# Patient Record
Sex: Female | Born: 2000 | Race: White | Hispanic: No | Marital: Single | Smoking: Never smoker
Health system: Southern US, Community
[De-identification: ages and names within clinical notes are randomized; demographics above are authoritative.]

## PROBLEM LIST (undated history)

## (undated) DIAGNOSIS — F909 Attention-deficit hyperactivity disorder, unspecified type: Secondary | ICD-10-CM

## (undated) DIAGNOSIS — F988 Other specified behavioral and emotional disorders with onset usually occurring in childhood and adolescence: Secondary | ICD-10-CM

## (undated) HISTORY — DX: Other specified behavioral and emotional disorders with onset usually occurring in childhood and adolescence: F98.8

---
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
## 2021-05-05 ENCOUNTER — Ambulatory Visit: Payer: Self-pay | Admitting: Nurse Practitioner

## 2021-05-29 ENCOUNTER — Ambulatory Visit (INDEPENDENT_AMBULATORY_CARE_PROVIDER_SITE_OTHER): Payer: Self-pay | Admitting: Nurse Practitioner

## 2021-05-29 ENCOUNTER — Encounter: Payer: Self-pay | Admitting: Nurse Practitioner

## 2021-05-29 VITALS — BP 122/63 | HR 87 | Temp 99.1°F | Ht 62.5 in | Wt 150.6 lb

## 2021-05-29 DIAGNOSIS — J029 Acute pharyngitis, unspecified: Secondary | ICD-10-CM | POA: Insufficient documentation

## 2021-05-29 MED ORDER — CETIRIZINE HCL 10 MG PO TABS: 10.0000 mg | ORAL_TABLET | Freq: Every day | ORAL | 11 refills | Status: DC

## 2021-05-29 MED ORDER — PREDNISONE 20 MG PO TABS: 20.0000 mg | ORAL_TABLET | Freq: Every day | ORAL | 0 refills | Status: AC
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
## 2021-05-29 NOTE — Patient Instructions (Signed)
1. Pharyngitis, unspecified etiology ? ?- predniSONE (DELTASONE) 20 MG tablet; Take 1 tablet (20 mg total) by mouth daily with breakfast for 5 days.  Dispense: 5 tablet; Refill: 0 ?- cetirizine (ZYRTEC) 10 MG tablet; Take 1 tablet (10 mg total) by mouth daily.  Dispense: 30 tablet; Refill: 11 ? ?Follow up: ? ?Follow up if needed ? ?

## 2021-05-29 NOTE — Assessment & Plan Note (Signed)
-   predniSONE (DELTASONE) 20 MG tablet; Take 1 tablet (20 mg total) by mouth daily with breakfast for 5 days.  Dispense: 5 tablet; Refill: 0 ?- cetirizine (ZYRTEC) 10 MG tablet; Take 1 tablet (10 mg total) by mouth daily.  Dispense: 30 tablet; Refill: 11 ? ?Follow up: ? ?Follow up if needed ?

## 2022-03-10 DIAGNOSIS — M62830 Muscle spasm of back: Secondary | ICD-10-CM | POA: Diagnosis not present

## 2022-03-10 DIAGNOSIS — M542 Cervicalgia: Secondary | ICD-10-CM | POA: Diagnosis not present

## 2022-03-10 DIAGNOSIS — M9902 Segmental and somatic dysfunction of thoracic region: Secondary | ICD-10-CM | POA: Diagnosis not present

## 2022-03-10 DIAGNOSIS — M9901 Segmental and somatic dysfunction of cervical region: Secondary | ICD-10-CM | POA: Diagnosis not present

## 2022-04-09 DIAGNOSIS — M9902 Segmental and somatic dysfunction of thoracic region: Secondary | ICD-10-CM | POA: Diagnosis not present

## 2022-04-09 DIAGNOSIS — M542 Cervicalgia: Secondary | ICD-10-CM | POA: Diagnosis not present

## 2022-04-09 DIAGNOSIS — M9901 Segmental and somatic dysfunction of cervical region: Secondary | ICD-10-CM | POA: Diagnosis not present

## 2022-04-09 DIAGNOSIS — M62830 Muscle spasm of back: Secondary | ICD-10-CM | POA: Diagnosis not present

## 2022-04-16 DIAGNOSIS — L700 Acne vulgaris: Secondary | ICD-10-CM | POA: Diagnosis not present

## 2022-04-16 DIAGNOSIS — L650 Telogen effluvium: Secondary | ICD-10-CM | POA: Diagnosis not present

## 2022-04-28 DIAGNOSIS — Z79899 Other long term (current) drug therapy: Secondary | ICD-10-CM | POA: Diagnosis not present

## 2022-04-28 DIAGNOSIS — Z0000 Encounter for general adult medical examination without abnormal findings: Secondary | ICD-10-CM | POA: Diagnosis not present

## 2022-05-07 DIAGNOSIS — M9902 Segmental and somatic dysfunction of thoracic region: Secondary | ICD-10-CM | POA: Diagnosis not present

## 2022-05-07 DIAGNOSIS — M9901 Segmental and somatic dysfunction of cervical region: Secondary | ICD-10-CM | POA: Diagnosis not present

## 2022-05-07 DIAGNOSIS — M62830 Muscle spasm of back: Secondary | ICD-10-CM | POA: Diagnosis not present

## 2022-05-07 DIAGNOSIS — M542 Cervicalgia: Secondary | ICD-10-CM | POA: Diagnosis not present

## 2022-05-14 DIAGNOSIS — Z309 Encounter for contraceptive management, unspecified: Secondary | ICD-10-CM | POA: Diagnosis not present

## 2022-05-14 DIAGNOSIS — Z01419 Encounter for gynecological examination (general) (routine) without abnormal findings: Secondary | ICD-10-CM | POA: Diagnosis not present

## 2022-05-14 DIAGNOSIS — Z6826 Body mass index (BMI) 26.0-26.9, adult: Secondary | ICD-10-CM | POA: Diagnosis not present

## 2022-05-14 DIAGNOSIS — Z113 Encounter for screening for infections with a predominantly sexual mode of transmission: Secondary | ICD-10-CM | POA: Diagnosis not present

## 2022-05-14 DIAGNOSIS — R350 Frequency of micturition: Secondary | ICD-10-CM | POA: Diagnosis not present

## 2022-05-25 DIAGNOSIS — L659 Nonscarring hair loss, unspecified: Secondary | ICD-10-CM | POA: Diagnosis not present

## 2022-06-04 DIAGNOSIS — M542 Cervicalgia: Secondary | ICD-10-CM | POA: Diagnosis not present

## 2022-06-04 DIAGNOSIS — M9901 Segmental and somatic dysfunction of cervical region: Secondary | ICD-10-CM | POA: Diagnosis not present

## 2022-06-04 DIAGNOSIS — M62830 Muscle spasm of back: Secondary | ICD-10-CM | POA: Diagnosis not present

## 2022-06-04 DIAGNOSIS — M9902 Segmental and somatic dysfunction of thoracic region: Secondary | ICD-10-CM | POA: Diagnosis not present

## 2022-06-10 DIAGNOSIS — L648 Other androgenic alopecia: Secondary | ICD-10-CM | POA: Diagnosis not present

## 2022-06-10 DIAGNOSIS — R202 Paresthesia of skin: Secondary | ICD-10-CM | POA: Diagnosis not present

## 2022-06-14 DIAGNOSIS — N3000 Acute cystitis without hematuria: Secondary | ICD-10-CM | POA: Diagnosis not present

## 2022-06-18 DIAGNOSIS — Z803 Family history of malignant neoplasm of breast: Secondary | ICD-10-CM | POA: Diagnosis not present

## 2022-06-23 DIAGNOSIS — L650 Telogen effluvium: Secondary | ICD-10-CM | POA: Diagnosis not present

## 2022-06-23 DIAGNOSIS — L648 Other androgenic alopecia: Secondary | ICD-10-CM | POA: Diagnosis not present

## 2022-07-14 DIAGNOSIS — M542 Cervicalgia: Secondary | ICD-10-CM | POA: Diagnosis not present

## 2022-07-14 DIAGNOSIS — R202 Paresthesia of skin: Secondary | ICD-10-CM | POA: Diagnosis not present

## 2022-07-14 DIAGNOSIS — M9901 Segmental and somatic dysfunction of cervical region: Secondary | ICD-10-CM | POA: Diagnosis not present

## 2022-07-14 DIAGNOSIS — M9902 Segmental and somatic dysfunction of thoracic region: Secondary | ICD-10-CM | POA: Diagnosis not present

## 2022-07-14 DIAGNOSIS — M62830 Muscle spasm of back: Secondary | ICD-10-CM | POA: Diagnosis not present

## 2022-07-20 DIAGNOSIS — R079 Chest pain, unspecified: Secondary | ICD-10-CM | POA: Diagnosis not present

## 2022-07-21 DIAGNOSIS — R079 Chest pain, unspecified: Secondary | ICD-10-CM | POA: Diagnosis not present

## 2022-07-28 DIAGNOSIS — M9902 Segmental and somatic dysfunction of thoracic region: Secondary | ICD-10-CM | POA: Diagnosis not present

## 2022-07-28 DIAGNOSIS — M62830 Muscle spasm of back: Secondary | ICD-10-CM | POA: Diagnosis not present

## 2022-07-28 DIAGNOSIS — M9901 Segmental and somatic dysfunction of cervical region: Secondary | ICD-10-CM | POA: Diagnosis not present

## 2022-07-28 DIAGNOSIS — M542 Cervicalgia: Secondary | ICD-10-CM | POA: Diagnosis not present

## 2022-08-05 DIAGNOSIS — L668 Other cicatricial alopecia: Secondary | ICD-10-CM | POA: Diagnosis not present

## 2022-08-05 DIAGNOSIS — L02821 Furuncle of head [any part, except face]: Secondary | ICD-10-CM | POA: Diagnosis not present

## 2022-08-12 DIAGNOSIS — R202 Paresthesia of skin: Secondary | ICD-10-CM | POA: Diagnosis not present

## 2022-08-12 DIAGNOSIS — L650 Telogen effluvium: Secondary | ICD-10-CM | POA: Diagnosis not present

## 2022-08-13 DIAGNOSIS — M9902 Segmental and somatic dysfunction of thoracic region: Secondary | ICD-10-CM | POA: Diagnosis not present

## 2022-08-13 DIAGNOSIS — M542 Cervicalgia: Secondary | ICD-10-CM | POA: Diagnosis not present

## 2022-08-13 DIAGNOSIS — M62830 Muscle spasm of back: Secondary | ICD-10-CM | POA: Diagnosis not present

## 2022-08-13 DIAGNOSIS — M9901 Segmental and somatic dysfunction of cervical region: Secondary | ICD-10-CM | POA: Diagnosis not present

## 2022-08-19 ENCOUNTER — Ambulatory Visit: Payer: Self-pay | Admitting: Family Medicine

## 2022-08-26 DIAGNOSIS — L668 Other cicatricial alopecia: Secondary | ICD-10-CM | POA: Diagnosis not present

## 2022-08-27 ENCOUNTER — Encounter: Payer: Self-pay | Admitting: Family Medicine

## 2022-08-27 ENCOUNTER — Ambulatory Visit: Payer: 59 | Admitting: Family Medicine

## 2022-08-27 VITALS — BP 115/60 | HR 75 | Temp 97.8°F | Ht 62.0 in | Wt 152.0 lb

## 2022-08-27 DIAGNOSIS — R011 Cardiac murmur, unspecified: Secondary | ICD-10-CM

## 2022-08-27 DIAGNOSIS — Z7689 Persons encountering health services in other specified circumstances: Secondary | ICD-10-CM | POA: Insufficient documentation

## 2022-08-27 NOTE — Patient Instructions (Signed)
It was great to meet you today and I'm excited to have you join the [facility] practice. I hope you had a positive experience today! If you feel so inclined, please feel free to recommend our practice to friends and family. Mila Merry, FNP-C

## 2022-08-27 NOTE — Progress Notes (Signed)
New Patient Office Visit  Subjective    Patient ID: Daisy Nunez, female    DOB: [birth year]  Age: 22 y.o. MRN: [id number]  CC:  Chief Complaint  Patient presents with   Establish Care    HPI Daisy Nunez presents to establish care. Oriented to practice routines and expectations. PMH includes ADD. She has been seeing a PCP regularly and had a wellness check 4 months ago with labs. She does have an OBGYN. Concerns include thinning hair, she has been seeing a dermatologist for this.  Tobacco: non-smoker STI: declines Vaccines:  UTD     Outpatient Encounter Medications as of 08/27/2022  Medication Sig   PREVIDENT 5000 BOOSTER PLUS 1.1 % PSTE Take 5,000 Bottles by mouth 1 day or 1 dose.   [DISCONTINUED] cetirizine (ZYRTEC) 10 MG tablet Take 1 tablet (10 mg total) by mouth daily. (Patient not taking: Reported on 08/27/2022)   [DISCONTINUED] methylphenidate (CONCERTA) 36 MG CR tablet Take 36 mg by mouth every morning. (Patient not taking: Reported on 05/29/2021)   No facility-administered encounter medications on file as of 08/27/2022.    Past Medical History:  Diagnosis Date   ADD (attention deficit disorder)    ADHD (attention deficit hyperactivity disorder)     History reviewed. No pertinent surgical history.  Family History  Problem Relation Age of Onset   Cancer Mother    Anxiety disorder Mother    Hypertension Father    Cancer Maternal Grandmother    Alcohol abuse Maternal Grandfather     Social History   Socioeconomic History   Marital status: Single    Spouse name: Not on file   Number of children: Not on file   Years of education: Not on file   Highest education level: Not on file  Occupational History   Not on file  Tobacco Use   Smoking status: Never   Smokeless tobacco: Not on file  Vaping Use   Vaping status: Some Days  Substance and Sexual Activity   Alcohol use: Not on file   Drug use: Not Currently   Sexual activity: Yes  Other Topics  Concern   Not on file  Social History Narrative   Not on file   Social Determinants of Health   Financial Resource Strain: Not on file  Food Insecurity: Not on file  Transportation Needs: Not on file  Physical Activity: Not on file  Stress: Not on file  Social Connections: Not on file  Intimate Partner Violence: Not on file    Review of Systems  Constitutional: Negative.   HENT: Negative.    Eyes: Negative.   Respiratory: Negative.    Cardiovascular: Negative.   Gastrointestinal: Negative.   Genitourinary: Negative.   Musculoskeletal: Negative.   Skin: Negative.        Thinning hair  Neurological: Negative.   Endo/Heme/Allergies: Negative.   Psychiatric/Behavioral: Negative.    All other systems reviewed and are negative.       Objective    BP 115/60   Pulse 75   Temp 97.8 F (36.6 C) (Oral)   Ht 5\' 2"  (1.575 m)   Wt 152 lb (68.9 kg)   LMP 08/05/2022 (Approximate)   SpO2 98%   BMI 27.80 kg/m   Physical Exam Vitals and nursing note reviewed.  Constitutional:      Appearance: Normal appearance. She is normal weight.  HENT:     Head: Normocephalic and atraumatic.     Right Ear: Tympanic membrane, ear canal and external ear  normal.     Left Ear: Tympanic membrane, ear canal and external ear normal.     Nose: Nose normal.     Mouth/Throat:     Mouth: Mucous membranes are moist.     Pharynx: Oropharynx is clear.  Eyes:     Extraocular Movements: Extraocular movements intact.     Conjunctiva/sclera: Conjunctivae normal.     Pupils: Pupils are equal, round, and reactive to light.  Cardiovascular:     Rate and Rhythm: Normal rate and regular rhythm.     Pulses: Normal pulses.     Heart sounds: Murmur heard.  Pulmonary:     Effort: Pulmonary effort is normal.     Breath sounds: Normal breath sounds.  Abdominal:     General: Bowel sounds are normal.     Palpations: Abdomen is soft.  Musculoskeletal:        General: Normal range of motion.     Cervical  back: Normal range of motion and neck supple.  Skin:    General: Skin is warm and dry.     Capillary Refill: Capillary refill takes less than 2 seconds.  Neurological:     General: No focal deficit present.     Mental Status: She is alert and oriented to person, place, and time. Mental status is at baseline.  Psychiatric:        Mood and Affect: Mood normal.        Behavior: Behavior normal.        Thought Content: Thought content normal.        Judgment: Judgment normal.         Assessment & Plan:   Problem List Items Addressed This Visit     Encounter to establish care with new doctor - Primary    Today we reviewed her PMH and current concerns. She is up to date on her physical and has also had labs recently. She has requested medical records. Will return to office for annual physical next April. PAP with GYN.      Relevant Orders   ECHOCARDIOGRAM COMPLETE   Ambulatory referral to Cardiology   Cardiac murmur    Murmur noted on exam, no known history of cardiac murmur. She did have an episode of chest pain recently and was worked up in the ED and it was deemed non-cardiac. No ECHO done at that time. Denies chest pain since then, lightheadedness, dizziness, shortness of breath. Will obtain ECHO and refer to Cardiology.       Return in about 9 months (around 05/27/2023) for annual physical with labs 1 week prior.   Park Meo, FNP

## 2022-08-27 NOTE — Assessment & Plan Note (Signed)
Today we reviewed her PMH and current concerns. She is up to date on her physical and has also had labs recently. She has requested medical records. Will return to office for annual physical next April. PAP with GYN.

## 2022-08-27 NOTE — Assessment & Plan Note (Signed)
Murmur noted on exam, no known history of cardiac murmur. She did have an episode of chest pain recently and was worked up in the ED and it was deemed non-cardiac. No ECHO done at that time. Denies chest pain since then, lightheadedness, dizziness, shortness of breath. Will obtain ECHO and refer to Cardiology.

## 2022-08-31 ENCOUNTER — Encounter (HOSPITAL_COMMUNITY): Payer: Self-pay

## 2022-09-01 ENCOUNTER — Ambulatory Visit: Payer: Self-pay | Admitting: Family Medicine

## 2022-09-03 ENCOUNTER — Ambulatory Visit (HOSPITAL_COMMUNITY): Payer: 59 | Attending: Family Medicine

## 2022-09-03 DIAGNOSIS — M62830 Muscle spasm of back: Secondary | ICD-10-CM | POA: Diagnosis not present

## 2022-09-03 DIAGNOSIS — Z7689 Persons encountering health services in other specified circumstances: Secondary | ICD-10-CM | POA: Insufficient documentation

## 2022-09-03 DIAGNOSIS — M9901 Segmental and somatic dysfunction of cervical region: Secondary | ICD-10-CM | POA: Diagnosis not present

## 2022-09-03 DIAGNOSIS — R011 Cardiac murmur, unspecified: Secondary | ICD-10-CM | POA: Diagnosis not present

## 2022-09-03 DIAGNOSIS — M9902 Segmental and somatic dysfunction of thoracic region: Secondary | ICD-10-CM | POA: Diagnosis not present

## 2022-09-03 DIAGNOSIS — M542 Cervicalgia: Secondary | ICD-10-CM | POA: Diagnosis not present

## 2022-09-03 LAB — ECHOCARDIOGRAM COMPLETE
Area-P 1/2: 4.35 cm2
S' Lateral: 3.25 cm

## 2022-09-08 ENCOUNTER — Telehealth: Payer: Self-pay

## 2022-09-08 DIAGNOSIS — L989 Disorder of the skin and subcutaneous tissue, unspecified: Secondary | ICD-10-CM | POA: Diagnosis not present

## 2022-09-08 DIAGNOSIS — L668 Other cicatricial alopecia: Secondary | ICD-10-CM | POA: Diagnosis not present

## 2022-09-08 NOTE — Telephone Encounter (Signed)
Pt called in to ask if nurse/NP would give her a cb to discuss the results of her recent electrocardiogram. Pt called office on 09/04/22 lvm. Pt would like a cb from nurse/np please.  Cb#: [phone]

## 2022-09-17 DIAGNOSIS — M62830 Muscle spasm of back: Secondary | ICD-10-CM | POA: Diagnosis not present

## 2022-09-17 DIAGNOSIS — M542 Cervicalgia: Secondary | ICD-10-CM | POA: Diagnosis not present

## 2022-09-17 DIAGNOSIS — M9902 Segmental and somatic dysfunction of thoracic region: Secondary | ICD-10-CM | POA: Diagnosis not present

## 2022-09-17 DIAGNOSIS — M9901 Segmental and somatic dysfunction of cervical region: Secondary | ICD-10-CM | POA: Diagnosis not present

## 2022-09-21 DIAGNOSIS — L668 Other cicatricial alopecia: Secondary | ICD-10-CM | POA: Diagnosis not present

## 2022-10-05 ENCOUNTER — Ambulatory Visit: Payer: 59 | Admitting: Family Medicine

## 2022-10-05 ENCOUNTER — Encounter: Payer: Self-pay | Admitting: Family Medicine

## 2022-10-05 VITALS — BP 100/64 | HR 85 | Temp 98.1°F | Ht 62.0 in | Wt 152.0 lb

## 2022-10-05 DIAGNOSIS — L669 Cicatricial alopecia, unspecified: Secondary | ICD-10-CM | POA: Insufficient documentation

## 2022-10-05 NOTE — Progress Notes (Signed)
Subjective:  HPI: Daisy Nunez is a 22 y.o. female presenting on 10/05/2022 for Follow-up (Issues w/hair loss/lab work per pt)   HPI Patient is in today for concerns about the cause of her hair loss. 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 Her dermatologist has been treating this with Doxycycline. Additional testing performed by her dermatologist includes iron studies, zinc, DHEA, testosterone, and TSH and all came back normal. She is here today wondering if there are additional tests that could provide insight into what is causing this hair loss.  Review of Systems  All other systems reviewed and are negative.   Relevant past medical history reviewed and updated as indicated.   Past Medical History:  Diagnosis Date   ADD (attention deficit disorder)    ADHD (attention deficit hyperactivity disorder)      No past surgical history on file.  Allergies and medications reviewed and updated.   Current Outpatient Medications:    betamethasone, augmented, (DIPROLENE) 0.05 % lotion, Apply topically daily as needed., Disp: , Rfl:    doxycycline (VIBRAMYCIN) 100 MG capsule, PLEASE SEE ATTACHED FOR DETAILED DIRECTIONS, Disp: , Rfl:    ketoconazole (NIZORAL) 2 % shampoo, PLEASE SEE ATTACHED FOR DETAILED DIRECTIONS, Disp: , Rfl:    PREVIDENT 5000 BOOSTER PLUS 1.1 % PSTE, Take 5,000 Bottles by mouth 1 day or 1 dose., Disp: , Rfl:    Vitamin D, Ergocalciferol, (DRISDOL) 1.25 MG (50000 UNIT) CAPS capsule, Take 50,000 Units by mouth once a week., Disp: , Rfl:    clindamycin (CLEOCIN T) 1 % lotion, APPLY TO FACE AND BACK NIGHTLY, Disp: , Rfl:   No Known Allergies  Objective:   BP 100/64   Pulse 85   Temp 98.1 F (36.7 C) (Oral)   Ht 5\' 2"  (1.575 m)   Wt 152 lb (68.9 kg)   LMP 09/30/2022 (Approximate)   SpO2 97%   BMI 27.80 kg/m      10/05/2022    3:28 PM 08/27/2022    2:09 PM 05/29/2021    1:26 PM  Vitals with BMI  Height  5\' 2"  5\' 2"  5' 2.5"  Weight 152 lbs 152 lbs 150 lbs 10 oz  BMI 27.79 27.79 27.09  Systolic 100 115 147  Diastolic 64 60 63  Pulse 85 75 87     Physical Exam Vitals and nursing note reviewed.  Constitutional:      Appearance: Normal appearance. She is normal weight.  HENT:     Head: Normocephalic and atraumatic.  Skin:    General: Skin is warm and dry.  Neurological:     General: No focal deficit present.     Mental Status: She is alert and oriented to person, place, and time. Mental status is at baseline.  Psychiatric:        Mood and Affect: Mood normal.        Behavior: Behavior normal.        Thought Content: Thought content normal.        Judgment: Judgment normal.     Assessment & Plan:  Cicatricial alopecia Assessment & Plan: Patient is here today seeking insight into what may be causing her alopecia hair loss. She has been thoroughly evaluated at Dermatology and is receiving appropriate treatment. I provided her with reassurance that they have performed any tests I would know to perform such as iron studies, hormone levels, and TSH. I provided her with the link to Newly Diagnosed (  [employer])  for more information about this condition including a list of dermatologists who specialize in this condition if she would like to seek a second opinion.      Follow up plan: Return if symptoms worsen or fail to improve.  Park Meo, FNP

## 2022-10-05 NOTE — Assessment & Plan Note (Signed)
Patient is here today seeking insight into what may be causing her alopecia hair loss. She has been thoroughly evaluated at Dermatology and is receiving appropriate treatment. I provided her with reassurance that they have performed any tests I would know to perform such as iron studies, hormone levels, and TSH. I provided her with the link to Newly Diagnosed ([email or web address])  for more information about this condition including a list of dermatologists who specialize in this condition if she would like to seek a second opinion.

## 2022-10-11 DIAGNOSIS — L669 Cicatricial alopecia, unspecified: Secondary | ICD-10-CM | POA: Diagnosis not present

## 2022-10-15 DIAGNOSIS — M542 Cervicalgia: Secondary | ICD-10-CM | POA: Diagnosis not present

## 2022-10-15 DIAGNOSIS — M62830 Muscle spasm of back: Secondary | ICD-10-CM | POA: Diagnosis not present

## 2022-10-15 DIAGNOSIS — M9901 Segmental and somatic dysfunction of cervical region: Secondary | ICD-10-CM | POA: Diagnosis not present

## 2022-10-15 DIAGNOSIS — M9902 Segmental and somatic dysfunction of thoracic region: Secondary | ICD-10-CM | POA: Diagnosis not present

## 2022-10-16 DIAGNOSIS — M791 Myalgia, unspecified site: Secondary | ICD-10-CM | POA: Diagnosis not present

## 2022-10-16 DIAGNOSIS — R1084 Generalized abdominal pain: Secondary | ICD-10-CM | POA: Diagnosis not present

## 2022-10-16 DIAGNOSIS — J301 Allergic rhinitis due to pollen: Secondary | ICD-10-CM | POA: Diagnosis not present

## 2022-10-16 DIAGNOSIS — E559 Vitamin D deficiency, unspecified: Secondary | ICD-10-CM | POA: Diagnosis not present

## 2022-10-23 DIAGNOSIS — M791 Myalgia, unspecified site: Secondary | ICD-10-CM | POA: Diagnosis not present

## 2022-10-23 DIAGNOSIS — E559 Vitamin D deficiency, unspecified: Secondary | ICD-10-CM | POA: Diagnosis not present

## 2022-10-23 DIAGNOSIS — L659 Nonscarring hair loss, unspecified: Secondary | ICD-10-CM | POA: Diagnosis not present

## 2022-10-23 DIAGNOSIS — J301 Allergic rhinitis due to pollen: Secondary | ICD-10-CM | POA: Diagnosis not present

## 2022-11-03 ENCOUNTER — Telehealth: Payer: Self-pay

## 2022-11-03 NOTE — Telephone Encounter (Signed)
Pt called in wanting to ask if pcp would send a referral for [facility] concerning her scalp pain. Please advise.  Pt cb#: [phone]  [facility] [phone]

## 2022-11-04 ENCOUNTER — Other Ambulatory Visit: Payer: Self-pay | Admitting: Family Medicine

## 2022-11-04 DIAGNOSIS — L669 Cicatricial alopecia, unspecified: Secondary | ICD-10-CM

## 2022-11-09 ENCOUNTER — Encounter: Payer: Self-pay | Admitting: Family Medicine

## 2022-11-18 DIAGNOSIS — L6689 Other cicatricial alopecia: Secondary | ICD-10-CM | POA: Diagnosis not present

## 2022-11-25 DIAGNOSIS — L659 Nonscarring hair loss, unspecified: Secondary | ICD-10-CM | POA: Diagnosis not present

## 2022-11-27 DIAGNOSIS — M62830 Muscle spasm of back: Secondary | ICD-10-CM | POA: Diagnosis not present

## 2022-11-27 DIAGNOSIS — M542 Cervicalgia: Secondary | ICD-10-CM | POA: Diagnosis not present

## 2022-11-27 DIAGNOSIS — M9901 Segmental and somatic dysfunction of cervical region: Secondary | ICD-10-CM | POA: Diagnosis not present

## 2022-11-27 DIAGNOSIS — M9902 Segmental and somatic dysfunction of thoracic region: Secondary | ICD-10-CM | POA: Diagnosis not present

## 2022-12-04 ENCOUNTER — Other Ambulatory Visit: Payer: Self-pay | Admitting: Medical Genetics

## 2022-12-04 DIAGNOSIS — M9902 Segmental and somatic dysfunction of thoracic region: Secondary | ICD-10-CM | POA: Diagnosis not present

## 2022-12-04 DIAGNOSIS — Z006 Encounter for examination for normal comparison and control in clinical research program: Secondary | ICD-10-CM

## 2022-12-04 DIAGNOSIS — M9901 Segmental and somatic dysfunction of cervical region: Secondary | ICD-10-CM | POA: Diagnosis not present

## 2022-12-04 DIAGNOSIS — M542 Cervicalgia: Secondary | ICD-10-CM | POA: Diagnosis not present

## 2022-12-04 DIAGNOSIS — M62830 Muscle spasm of back: Secondary | ICD-10-CM | POA: Diagnosis not present

## 2022-12-08 ENCOUNTER — Other Ambulatory Visit: Payer: 59

## 2022-12-08 DIAGNOSIS — Z4802 Encounter for removal of sutures: Secondary | ICD-10-CM | POA: Diagnosis not present

## 2022-12-08 DIAGNOSIS — L650 Telogen effluvium: Secondary | ICD-10-CM | POA: Diagnosis not present

## 2022-12-08 DIAGNOSIS — L658 Other specified nonscarring hair loss: Secondary | ICD-10-CM | POA: Diagnosis not present

## 2022-12-11 ENCOUNTER — Other Ambulatory Visit: Payer: 59

## 2022-12-12 ENCOUNTER — Other Ambulatory Visit: Payer: 59

## 2022-12-14 DIAGNOSIS — E559 Vitamin D deficiency, unspecified: Secondary | ICD-10-CM | POA: Diagnosis not present

## 2022-12-14 DIAGNOSIS — R5383 Other fatigue: Secondary | ICD-10-CM | POA: Diagnosis not present

## 2022-12-14 DIAGNOSIS — E538 Deficiency of other specified B group vitamins: Secondary | ICD-10-CM | POA: Diagnosis not present

## 2022-12-14 DIAGNOSIS — R1010 Upper abdominal pain, unspecified: Secondary | ICD-10-CM | POA: Diagnosis not present

## 2022-12-18 ENCOUNTER — Other Ambulatory Visit: Payer: 59

## 2022-12-18 DIAGNOSIS — M62830 Muscle spasm of back: Secondary | ICD-10-CM | POA: Diagnosis not present

## 2022-12-18 DIAGNOSIS — M542 Cervicalgia: Secondary | ICD-10-CM | POA: Diagnosis not present

## 2022-12-18 DIAGNOSIS — M9901 Segmental and somatic dysfunction of cervical region: Secondary | ICD-10-CM | POA: Diagnosis not present

## 2022-12-18 DIAGNOSIS — M9902 Segmental and somatic dysfunction of thoracic region: Secondary | ICD-10-CM | POA: Diagnosis not present

## 2022-12-19 ENCOUNTER — Other Ambulatory Visit
Admission: RE | Admit: 2022-12-19 | Discharge: 2022-12-19 | Disposition: A | Discharge status: Home / Self Care | Payer: Self-pay | Source: Ambulatory Visit | Attending: Medical Genetics | Admitting: Medical Genetics

## 2022-12-19 DIAGNOSIS — Z006 Encounter for examination for normal comparison and control in clinical research program: Secondary | ICD-10-CM | POA: Insufficient documentation

## 2022-12-29 DIAGNOSIS — M940 Chondrocostal junction syndrome [Tietze]: Secondary | ICD-10-CM | POA: Diagnosis not present

## 2023-01-03 LAB — GENECONNECT MOLECULAR SCREEN: Genetic Analysis Overall Interpretation: NEGATIVE

## 2023-01-04 DIAGNOSIS — L650 Telogen effluvium: Secondary | ICD-10-CM | POA: Diagnosis not present

## 2023-01-04 DIAGNOSIS — L658 Other specified nonscarring hair loss: Secondary | ICD-10-CM | POA: Diagnosis not present

## 2023-01-04 DIAGNOSIS — L239 Allergic contact dermatitis, unspecified cause: Secondary | ICD-10-CM | POA: Diagnosis not present

## 2023-01-06 DIAGNOSIS — M542 Cervicalgia: Secondary | ICD-10-CM | POA: Diagnosis not present

## 2023-01-06 DIAGNOSIS — M9901 Segmental and somatic dysfunction of cervical region: Secondary | ICD-10-CM | POA: Diagnosis not present

## 2023-01-06 DIAGNOSIS — M9902 Segmental and somatic dysfunction of thoracic region: Secondary | ICD-10-CM | POA: Diagnosis not present

## 2023-01-06 DIAGNOSIS — M62830 Muscle spasm of back: Secondary | ICD-10-CM | POA: Diagnosis not present

## 2023-01-06 DIAGNOSIS — R519 Headache, unspecified: Secondary | ICD-10-CM | POA: Diagnosis not present

## 2023-01-06 DIAGNOSIS — L669 Cicatricial alopecia, unspecified: Secondary | ICD-10-CM | POA: Diagnosis not present

## 2023-01-12 DIAGNOSIS — R202 Paresthesia of skin: Secondary | ICD-10-CM | POA: Diagnosis not present

## 2023-01-16 ENCOUNTER — Ambulatory Visit (HOSPITAL_COMMUNITY)
Admission: EM | Admit: 2023-01-16 | Discharge: 2023-01-16 | Disposition: A | Discharge status: Home / Self Care | Payer: 59 | Attending: Family Medicine | Admitting: Family Medicine

## 2023-01-16 ENCOUNTER — Encounter (HOSPITAL_COMMUNITY): Payer: Self-pay

## 2023-01-16 DIAGNOSIS — T148XXA Other injury of unspecified body region, initial encounter: Secondary | ICD-10-CM

## 2023-01-16 DIAGNOSIS — R238 Other skin changes: Secondary | ICD-10-CM

## 2023-01-16 DIAGNOSIS — L659 Nonscarring hair loss, unspecified: Secondary | ICD-10-CM

## 2023-01-16 MED ORDER — CYCLOBENZAPRINE HCL 10 MG PO TABS: 10.0000 mg | ORAL_TABLET | Freq: Every evening | ORAL | 0 refills | Status: DC | PRN

## 2023-01-16 MED ORDER — IBUPROFEN 400 MG PO TABS: 400.0000 mg | ORAL_TABLET | Freq: Three times a day (TID) | ORAL | 0 refills | Status: AC | PRN

## 2023-01-16 NOTE — ED Triage Notes (Signed)
Patient here today with c/o right inner thigh pain X 4 days after doing a split while dancing.   Patient also c/o a burning sensation on scalp x 8 months after getting box braids. Patient has been treated for this in the past but still having symptoms.

## 2023-01-16 NOTE — ED Provider Notes (Addendum)
[facility]    CSN: [id number] Arrival date & time: 01/16/23  1249      History   Chief Complaint Chief Complaint  Patient presents with   Leg Injury    HPI Daisy Nunez is a 22 y.o. female.   The history is provided by the patient. No language interpreter was used.  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 Leg location:  R leg Pain details:    Quality: Feels soreness of her right inner thigh.   Radiates to:  Does not radiate   Severity:  Mild   Onset quality:  Sudden   Duration:  4 days   Progression:  Waxing and waning (Only feels mild pain with certain movement) Chronicity:  New Dislocation: no   Relieved by:  Rest Worsened by:  Extension and abduction Ineffective treatments:  None tried Associated symptoms: no decreased ROM, no muscle weakness, no numbness, no stiffness and no swelling    Scalp irritation: She also complained of scalp irritation and hair loss since she got braids on . She denies any scalp lesions.  Past Medical History:  Diagnosis Date   ADD (attention deficit disorder)    ADHD (attention deficit hyperactivity disorder)     Patient Active Problem List   Diagnosis Date Noted   Cicatricial alopecia 10/05/2022   Encounter to establish care with new doctor 08/27/2022   Cardiac murmur 08/27/2022   Pharyngitis 05/29/2021   Attention deficit hyperactivity disorder, combined type 03/04/2015    History reviewed. No pertinent surgical history.  OB History   No obstetric history on file.      Home Medications    Prior to Admission medications   Medication Sig Start Date End Date Taking? Authorizing Provider  cyclobenzaprine (FLEXERIL) 10 MG tablet Take 1 tablet (10 mg total) by mouth at bedtime as needed for muscle spasms. 01/16/23  Yes Doreene Eland, MD   ibuprofen (ADVIL) 400 MG tablet Take 1 tablet (400 mg total) by mouth every 8 (eight) hours as needed for up to 14 days for moderate pain (pain score 4-6). 01/16/23 01/30/23 Yes Doreene Eland, MD  betamethasone, augmented, (DIPROLENE) 0.05 % lotion Apply topically daily as needed. 10/03/22   [provider]  clindamycin (CLEOCIN T) 1 % lotion APPLY TO FACE AND BACK NIGHTLY    [provider]  ketoconazole (NIZORAL) 2 % shampoo PLEASE SEE ATTACHED FOR DETAILED DIRECTIONS 08/26/22   [provider]  PREVIDENT 5000 BOOSTER PLUS 1.1 % PSTE Take 5,000 Bottles by mouth 1 day or 1 dose. 05/22/21   [provider]  Vitamin D, Ergocalciferol, (DRISDOL) 1.25 MG (50000 UNIT) CAPS capsule Take 50,000 Units by mouth once a week. 09/25/22   [provider]    Family History Family History  Problem Relation Age of Onset   Cancer Mother    Anxiety disorder Mother    Hypertension Father    Cancer Maternal Grandmother    Alcohol abuse Maternal Grandfather     Social History Social History   Tobacco Use   Smoking status: Never  Vaping Use   Vaping status: Former  Substance Use Topics   Alcohol use: Never   Drug use: Not Currently     Allergies   Patient has no known allergies.  Review of Systems Review of Systems  Musculoskeletal:  Negative for stiffness.     Physical Exam Triage Vital Signs ED Triage Vitals  Encounter Vitals Group     BP      Systolic BP Percentile      Diastolic BP Percentile      Pulse      Resp      Temp      Temp src      SpO2      Weight      Height      Head Circumference      Peak Flow      Pain Score      Pain Loc      Pain Education      Exclude from Growth Chart    No data found.  Updated Vital Signs BP 123/71 (BP Location: Right Arm)   Pulse 93   Temp 98.7 F (37.1 C) (Oral)   Resp 16   Ht 5\' 3"  (1.6 m)   Wt 68 kg   LMP 12/27/2022 (Exact Date)   SpO2 98%   BMI 26.57 kg/m   Visual  Acuity Right Eye Distance:   Left Eye Distance:   Bilateral Distance:    Right Eye Near:   Left Eye Near:    Bilateral Near:     Physical Exam Vitals and nursing note reviewed.  HENT:     Head:     Comments: Two areas of  pinkish hyperpigmented round lesion - she said this was from when she had a biopsy of her scalp. Otherwise, no lesion observed Cardiovascular:     Rate and Rhythm: Normal rate and regular rhythm.     Heart sounds: Normal heart sounds. No murmur heard. Pulmonary:     Effort: Pulmonary effort is normal. No respiratory distress.     Breath sounds: Normal breath sounds. No wheezing.  Musculoskeletal:     Right hip: Normal range of motion.     Right upper leg: No swelling, edema, deformity or tenderness.     Left upper leg: No swelling, deformity or tenderness.     Comments: No tenderness with adduction,flexion and extension of her right hip and knee joints. Very mild tenderness with right hip abduction. No swelling, erythema or tenderness of her right thigh or LL      UC Treatments / Results  Labs (all labs ordered are listed, but only abnormal results are displayed) Labs Reviewed - No data to display  EKG   Radiology No results found.  Procedures Procedures (including critical care time)  Medications Ordered in UC Medications - No data to display  Initial Impression / Assessment and Plan / UC Course  I have reviewed the triage vital signs and the nursing notes.  Pertinent labs & imaging results that were available during my care of the patient were reviewed by me and considered in my medical decision making (see chart for details).  Clinical Course as of 01/16/23 1555  Sat Jan 16, 2023  1339 Right thigh muscle strain Conservative measures discussed Ibuprofen and Flexeril prn pain discussed Meds escribed Return to work precautions discussed F/U as needed [KE]  1555 Scalp irritation/hair loss Hx of alopecia Topical steroids recommended She  declined meds for now F/U with dermatologist soon [KE]    Clinical Course User Index [KE] Doreene Eland, MD    Final Clinical Impressions(s) / UC Diagnoses   Final diagnoses:  Muscle strain     Discharge Instructions  It was nice seeing you. I am sorry about your muscle strain. This should heal over a period of time. Please use Ibuprofen prn as prescribed for pain as well as muscle relaxant. Avoid the use of muscle relaxants while driving or operating machinery, as they can cause drowsiness. See [place] soon if there is no improvement in your symptoms. 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       ED Prescriptions     Medication Sig Dispense Auth. Provider   ibuprofen (ADVIL) 400 MG tablet Take 1 tablet (400 mg total) by mouth every 8 (eight) hours as needed for up to 14 days for moderate pain (pain score 4-6). 30 tablet Janit Pagan T, MD   cyclobenzaprine (FLEXERIL) 10 MG tablet Take 1 tablet (10 mg total) by mouth at bedtime as needed for muscle spasms. 10 tablet Doreene Eland, MD      PDMP not reviewed this encounter.   Doreene Eland, MD 01/16/23 1340    Doreene Eland, MD 01/16/23 1556

## 2023-01-16 NOTE — Discharge Instructions (Addendum)
It was nice seeing you. I am sorry about your muscle strain. This should heal over a period of time. Please use Ibuprofen prn as prescribed for pain as well as muscle relaxant. Avoid the use of muscle relaxants while driving or operating machinery, as they can cause drowsiness. See [place] soon if there is no improvement in your symptoms. 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

## 2023-01-24 ENCOUNTER — Telehealth: Payer: 59 | Admitting: Family

## 2023-01-24 DIAGNOSIS — L659 Nonscarring hair loss, unspecified: Secondary | ICD-10-CM | POA: Diagnosis not present

## 2023-01-24 NOTE — Progress Notes (Signed)
Virtual Visit Consent   Daisy Nunez, you are scheduled for a virtual visit with a [facility] provider today. Just as with appointments in the office, your consent must be obtained to participate. Your consent will be active for this visit and any virtual visit you may have with one of our providers in the next 365 days. If you have a MyChart account, a copy of this consent can be sent to you electronically.  As this is a virtual visit, video technology does not allow for your provider to perform a traditional examination. This may limit your provider's ability to fully assess your condition. If your provider identifies any concerns that need to be evaluated in person or the need to arrange testing (such as labs, EKG, etc.), we will make arrangements to do so. Although advances in technology are sophisticated, we cannot ensure that it will always work on either your end or our end. If the connection with a video visit is poor, the visit may have to be switched to a telephone visit. With either a video or telephone visit, we are not always able to ensure that we have a secure connection.  By engaging in this virtual visit, you consent to the provision of healthcare and authorize for your insurance to be billed (if applicable) for the services provided during this visit. Depending on your insurance coverage, you may receive a charge related to this service.  I need to obtain your verbal consent now. Are you willing to proceed with your visit today? [name] has provided verbal consent on 01/24/2023 for a virtual visit (video or telephone). Jannifer Rodney, FNP  Date: 01/24/2023 2:20 PM  Virtual Visit via Video Note   I, Jannifer Rodney, connected with  Daisy Nunez  ([id number], March 26, 2000) on 01/24/23 at  2:30 PM EST by a video-enabled telemedicine application and verified that I am speaking with the correct person using two identifiers.  Location: Patient: Virtual Visit Location Patient:  Home Provider: Virtual Visit Location Provider: Home Office   I discussed the limitations of evaluation and management by telemedicine and the availability of in person appointments. The patient expressed understanding and agreed to proceed.    History of Present Illness: Daisy Nunez is a 22 y.o. who identifies as a female who was assigned female at birth, and is being seen today for hair loss. She is followed by dermatologists and had steroid injections. She is worried and requesting labs drawn. She has an appointment with 02/23/23 with dermatologists.   HPI: HPI  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   Encounter to establish care with new doctor 08/27/2022   Cardiac murmur 08/27/2022   Pharyngitis 05/29/2021   Attention deficit hyperactivity disorder, combined type 03/04/2015    Allergies: No Known Allergies Medications:  Current Outpatient Medications:    betamethasone, augmented, (DIPROLENE) 0.05 % lotion, Apply topically daily as needed., Disp: , Rfl:    clindamycin (CLEOCIN T) 1 % lotion, APPLY TO FACE AND BACK NIGHTLY, Disp: , Rfl:    cyclobenzaprine (FLEXERIL) 10 MG tablet, Take 1 tablet (10 mg total) by mouth at bedtime as needed for muscle spasms., Disp: 10 tablet, Rfl: 0   ibuprofen (ADVIL) 400 MG tablet, Take 1 tablet (400 mg total) by mouth every 8 (eight) hours as needed for up to 14 days for moderate pain (pain score 4-6)., Disp: 30 tablet, Rfl: 0   ketoconazole (NIZORAL) 2 % shampoo, PLEASE SEE ATTACHED FOR  DETAILED DIRECTIONS, Disp: , Rfl:    PREVIDENT 5000 BOOSTER PLUS 1.1 % PSTE, Take 5,000 Bottles by mouth 1 day or 1 dose., Disp: , Rfl:    Vitamin D, Ergocalciferol, (DRISDOL) 1.25 MG (50000 UNIT) CAPS capsule, Take 50,000 Units by mouth once a week., Disp: , Rfl:   Observations/Objective: Patient is well-developed, well-nourished in no acute distress.  Resting comfortably  at home.  Head is  normocephalic, atraumatic.  No labored breathing.  Speech is clear and coherent with logical content.  Patient is alert and oriented at baseline.    Assessment and Plan: 1. Hair loss (Primary) - CMP14+EGFR; Future - CBC with Differential/Platelet; Future - TSH; Future  Labs pending  Stress management  Keep dermatologists follow up   Follow Up Instructions: I discussed the assessment and treatment plan with the patient. The patient was provided an opportunity to ask questions and all were answered. The patient agreed with the plan and demonstrated an understanding of the instructions.  A copy of instructions were sent to the patient via MyChart unless otherwise noted below.     The patient was advised to call back or seek an in-person evaluation if the symptoms worsen or if the condition fails to improve as anticipated.    Jannifer Rodney, FNP

## 2023-01-24 NOTE — Patient Instructions (Signed)
 Alopecia Areata, Adult  Alopecia areata is a condition that causes hair loss. A person with this condition may lose hair on the scalp in patches. In some cases, a person may lose all the hair on the scalp or all the hair from the face and body. Having this condition can be emotionally difficult, but it is not dangerous. Alopecia areata is an autoimmune disease. This means that your body's defense system (immune system) mistakes normal parts of the body for germs or other things that can make you sick. When you have alopecia areata, the immune system attacks the hair follicles. What are the causes? The cause of this condition is not known. What increases the risk? You are more likely to develop this condition if you have: A family history of alopecia. A family history of another autoimmune disease, including type 1 diabetes and thyroid autoimmune disease. Eczema, asthma, and allergies. Down syndrome. What are the signs or symptoms? The main symptom of this condition is round spots of patchy hair loss on the scalp. The spots may be mildly itchy. Other symptoms include: Short dark hairs in the bald patches that are wider at the top (exclamation point hairs). Dents, white spots, or lines in the fingernails or toenails. Balding and body hair loss. This is rare. Alopecia areata usually develops in childhood, but it can develop at any age. For some people, their hair grows back on its own and hair loss does not happen again. For others, their hair may fall out and grow back in cycles. The hair loss may last many years. How is this diagnosed? This condition is diagnosed based on your symptoms and family history. Your health care provider will also check your scalp skin, teeth, and nails. Your health care provider may refer you to a specialist in hair and skin disorders (dermatologist). You may also have tests, including: A hair pull test. Blood tests or other screening tests to check for autoimmune  diseases, such as thyroid disease or diabetes. Skin biopsy to confirm the diagnosis. A procedure to examine the skin with a lighted magnifying instrument (dermoscopy). How is this treated? There is no cure for alopecia areata. The goals of treatment are to promote the regrowth of hair and prevent the immune system from overreacting. No single treatment is right for all people with alopecia areata. It depends on the type of hair loss you have and how severe it is. Work with your health care provider to find the best treatment for you. Treatment may include: Regular checkups to make sure the condition is not getting worse . This is called watchful waiting. Using steroid creams or pills for 6-8 weeks to stop the immune reaction and help hair to regrow more quickly. Using other medicines on your skin (topical medicines) to change the immune system response and support the hair growth cycle. Steroid injections. Therapy and counseling with a support group or therapist if you are having trouble coping with hair loss. Follow these instructions at home: Medicines Apply topical creams only as told by your health care provider. Take over-the-counter and prescription medicines only as told by your health care provider. General instructions Learn as much as you can about your condition. Consider getting a wig or products to make hair look fuller or to cover bald spots, if you feel uncomfortable with your appearance. Get therapy or counseling if you are having a hard time coping with hair loss. Ask your health care provider to recommend a counselor or support group. Keep  all follow-up visits as told by your health care provider. This is important. Where to find more information National Alopecia Areata Foundation: naaf.org Contact a health care provider if: Your hair loss gets worse, even with treatment. You have new symptoms. You are struggling emotionally. Get help right away if: You have a sudden  worsening of the hair loss. Summary Alopecia areata is an autoimmune condition that makes your body's defense system (immune system) attack the hair follicles. This causes you to lose hair. Having this condition can be emotionally difficult, but it is not dangerous. Treatments may include regular checkups to make sure that the condition is not getting worse, medicines, and steroid injections. This information is not intended to replace advice given to you by your health care provider. Make sure you discuss any questions you have with your health care provider. Document Revised: 03/24/2019 Document Reviewed: 03/28/2019 Elsevier Patient Education  2024 [employer].

## 2023-02-04 DIAGNOSIS — M542 Cervicalgia: Secondary | ICD-10-CM | POA: Diagnosis not present

## 2023-02-04 DIAGNOSIS — M9901 Segmental and somatic dysfunction of cervical region: Secondary | ICD-10-CM | POA: Diagnosis not present

## 2023-02-04 DIAGNOSIS — M9902 Segmental and somatic dysfunction of thoracic region: Secondary | ICD-10-CM | POA: Diagnosis not present

## 2023-02-04 DIAGNOSIS — M62830 Muscle spasm of back: Secondary | ICD-10-CM | POA: Diagnosis not present

## 2023-02-05 ENCOUNTER — Other Ambulatory Visit: Payer: Self-pay | Admitting: Family

## 2023-02-05 DIAGNOSIS — L659 Nonscarring hair loss, unspecified: Secondary | ICD-10-CM | POA: Diagnosis not present

## 2023-02-06 LAB — COMPREHENSIVE METABOLIC PANEL WITH GFR
ALT: 14 IU/L (ref 0–32)
AST: 18 IU/L (ref 0–40)
Albumin: 4.8 g/dL (ref 4.0–5.0)
Alkaline Phosphatase: 59 IU/L (ref 44–121)
BUN/Creatinine Ratio: 17 (ref 9–23)
BUN: 12 mg/dL (ref 6–20)
Bilirubin Total: 0.4 mg/dL (ref 0.0–1.2)
CO2: 26 mmol/L (ref 20–29)
Calcium: 9.5 mg/dL (ref 8.7–10.2)
Chloride: 103 mmol/L (ref 96–106)
Creatinine, Ser: 0.69 mg/dL (ref 0.57–1.00)
Globulin, Total: 2.1 g/dL (ref 1.5–4.5)
Glucose: 88 mg/dL (ref 70–99)
Potassium: 4.2 mmol/L (ref 3.5–5.2)
Sodium: 140 mmol/L (ref 134–144)
Total Protein: 6.9 g/dL (ref 6.0–8.5)
eGFR: 127 mL/min/1.73 (ref >59)

## 2023-02-06 LAB — CBC WITH DIFFERENTIAL/PLATELET
Basophils Absolute: 0 10*3/uL (ref 0.0–0.2)
Basos: 1 %
EOS (ABSOLUTE): 0.1 10*3/uL (ref 0.0–0.4)
Eos: 1 %
Hematocrit: 41.1 % (ref 34.0–46.6)
Hemoglobin: 13.1 g/dL (ref 11.1–15.9)
Immature Grans (Abs): 0 10*3/uL (ref 0.0–0.1)
Immature Granulocytes: 0 %
Lymphocytes Absolute: 1.1 10*3/uL (ref 0.7–3.1)
Lymphs: 19 %
MCH: 28 pg (ref 26.6–33.0)
MCHC: 31.9 g/dL (ref 31.5–35.7)
MCV: 88 fL (ref 79–97)
Monocytes Absolute: 0.4 10*3/uL (ref 0.1–0.9)
Monocytes: 7 %
Neutrophils Absolute: 4.4 10*3/uL (ref 1.4–7.0)
Neutrophils: 72 %
Platelets: 251 10*3/uL (ref 150–450)
RBC: 4.68 x10E6/uL (ref 3.77–5.28)
RDW: 11.9 % (ref 11.7–15.4)
WBC: 6.1 10*3/uL (ref 3.4–10.8)

## 2023-02-06 LAB — TSH: TSH: 0.633 u[IU]/mL (ref 0.450–4.500)

## 2023-02-08 ENCOUNTER — Telehealth: Payer: 59 | Admitting: Physician Assistant

## 2023-02-08 DIAGNOSIS — R202 Paresthesia of skin: Secondary | ICD-10-CM | POA: Diagnosis not present

## 2023-02-08 DIAGNOSIS — L669 Cicatricial alopecia, unspecified: Secondary | ICD-10-CM

## 2023-02-08 NOTE — Progress Notes (Signed)
 Virtual Visit Consent   Daisy Nunez, you are scheduled for a virtual visit with a [facility] provider today. Just as with appointments in the office, your consent must be obtained to participate. Your consent will be active for this visit and any virtual visit you may have with one of our providers in the next 365 days. If you have a MyChart account, a copy of this consent can be sent to you electronically.  As this is a virtual visit, video technology does not allow for your provider to perform a traditional examination. This may limit your provider's ability to fully assess your condition. If your provider identifies any concerns that need to be evaluated in person or the need to arrange testing (such as labs, EKG, etc.), we will make arrangements to do so. Although advances in technology are sophisticated, we cannot ensure that it will always work on either your end or our end. If the connection with a video visit is poor, the visit may have to be switched to a telephone visit. With either a video or telephone visit, we are not always able to ensure that we have a secure connection.  By engaging in this virtual visit, you consent to the provision of healthcare and authorize for your insurance to be billed (if applicable) for the services provided during this visit. Depending on your insurance coverage, you may receive a charge related to this service.  I need to obtain your verbal consent now. Are you willing to proceed with your visit today? Jamyah Folk has provided verbal consent on 02/08/2023 for a virtual visit (video or telephone). Kirk GORMAN Sage, PA-C  Date: 02/08/2023 5:13 PM  Virtual Visit via Video Note   I, Daisy Nunez, connected with  [name]  ([id number], 11-08-2000) on 02/08/23 at  5:00 PM EST by a video-enabled telemedicine application and verified that I am speaking with the correct person using two identifiers.  Location: Patient: Virtual Visit Location Patient:  Mobile Provider: Virtual Visit Location Provider: Home Office   I discussed the limitations of evaluation and management by telemedicine and the availability of in person appointments. The patient expressed understanding and agreed to proceed.    History of Present Illness: Daisy Nunez is a 23 y.o. who identifies as a female who was assigned female at birth, and is being seen today for a 80-month history of persistent scalp inflammation and hair loss. The patient has been under the care of multiple dermatologists and has undergone two scalp biopsies. The biopsies have suggested a range of potential diagnoses, including fibrosing alopecia, telogen effluvium, and traction alopecia. The onset of symptoms was noted after the patient had braids done.  The patient has been treated with doxycycline  and minocycline, but the inflammation has persisted. Recently, the patient has noticed new indentations in the scalp, which she attributes to the ongoing inflammation. The patient has a family history of lupus, and one of the dermatologists suggested the possibility of fibrosing alopecia with central centrifugal cicatricial alopecia (CCCA), which has an autoimmune component.  The patient had a telehealth visit with a nurse practitioner who ordered routine blood work, including thyroid , kidney and liver function tests, and anemia screening. All results were reported as normal. The patient is seeking further investigation into potential autoimmune markers that could explain the persistent inflammation and hair loss.  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    Allergies: No Known Allergies Medications:  Current Outpatient Medications:    betamethasone, augmented,  (DIPROLENE) 0.05 % lotion, Apply topically daily as needed., Disp: , Rfl:    clindamycin (CLEOCIN T) 1 % lotion, APPLY TO FACE AND BACK NIGHTLY, Disp: , Rfl:    cyclobenzaprine  (FLEXERIL ) 10 MG tablet, Take 1 tablet (10 mg total) by mouth at bedtime as needed for muscle spasms., Disp: 10 tablet, Rfl: 0   ketoconazole (NIZORAL) 2 % shampoo, PLEASE SEE ATTACHED FOR DETAILED DIRECTIONS, Disp: , Rfl:    PREVIDENT 5000 BOOSTER PLUS 1.1 % PSTE, Take 5,000 Bottles by mouth 1 day or 1 dose., Disp: , Rfl:    Vitamin D , Ergocalciferol , (DRISDOL) 1.25 MG (50000 UNIT) CAPS capsule, Take 50,000 Units by mouth once a week., Disp: , Rfl:   Observations/Objective: Patient is well-developed, well-nourished in no acute distress.  Resting comfortably  at home.  Head is normocephalic, atraumatic.  No labored breathing.  Speech is clear and coherent with logical content.  Patient is alert and oriented at baseline.    Assessment and Plan: 1. Cicatricial alopecia (Primary)  Patient has appointment with a different dermatologist tomorrow for a second opinion.  Encouraged patient to keep that appointment and then follow-up with primary care if lab work is suggested and/or warranted.   Follow Up Instructions: I discussed the assessment and treatment plan with the patient. The patient was provided an opportunity to ask questions and all were answered. The patient agreed with the plan and demonstrated an understanding of the instructions.  A copy of instructions were sent to the patient via MyChart unless otherwise noted below.   Patient has requested to receive PHI (AVS, Work Notes, etc) pertaining to this video visit through e-mail as they are currently without active MyChart. They have voiced understand that email is not considered secure and their health information could be viewed by someone other than the patient.   The patient was advised to call back or seek an in-person evaluation if the symptoms worsen or  if the condition fails to improve as anticipated.    Kirk RAMAN Mayers, PA-C

## 2023-02-08 NOTE — Addendum Note (Signed)
 Addended by: Roney Jaffe on: 02/08/2023 05:38 PM   Modules accepted: Level of Service

## 2023-02-08 NOTE — Patient Instructions (Addendum)
  Norlene Pouch, thank you for joining Kirk GORMAN Sage, PA-C for today's virtual visit.  While this provider is not your primary care provider (PCP), if your PCP is located in our provider database this encounter information will be shared with them immediately following your visit.   A [facility] MyChart account gives you access to today's visit and all your visits, tests, and labs performed at [facility]  click here if you don't have a [facility] MyChart account or go to [email or web address]  Consent: (Patient) Daisy Nunez provided verbal consent for this virtual visit at the beginning of the encounter.  Current Medications:  Current Outpatient Medications:    betamethasone, augmented, (DIPROLENE) 0.05 % lotion, Apply topically daily as needed., Disp: , Rfl:    clindamycin (CLEOCIN T) 1 % lotion, APPLY TO FACE AND BACK NIGHTLY, Disp: , Rfl:    cyclobenzaprine  (FLEXERIL ) 10 MG tablet, Take 1 tablet (10 mg total) by mouth at bedtime as needed for muscle spasms., Disp: 10 tablet, Rfl: 0   ketoconazole (NIZORAL) 2 % shampoo, PLEASE SEE ATTACHED FOR DETAILED DIRECTIONS, Disp: , Rfl:    PREVIDENT 5000 BOOSTER PLUS 1.1 % PSTE, Take 5,000 Bottles by mouth 1 day or 1 dose., Disp: , Rfl:    Vitamin D , Ergocalciferol , (DRISDOL) 1.25 MG (50000 UNIT) CAPS capsule, Take 50,000 Units by mouth once a week., Disp: , Rfl:    Medications ordered in this encounter:  No orders of the defined types were placed in this encounter.    *If you need refills on other medications prior to your next appointment, please contact your pharmacy*  Follow-Up: Call back or seek an in-person evaluation if the symptoms worsen or if the condition fails to improve as anticipated.  [facility] [phone]  Other Instructions I encourage you to keep your visit with dermatology tomorrow for further evaluation.   If you have been instructed to have an in-person evaluation today at a  local Urgent Care facility, please use the link below. It will take you to a list of all of our available [facility], including address, phone number and hours of operation. Please do not delay care.  [facility]  If you or a family member do not have a primary care provider, use the link below to schedule a visit and establish care. When you choose a [facility] primary care physician or advanced practice provider, you gain a long-term partner in health. Find a Primary Care Provider  Learn more about [facility] in-office and virtual care options: [facility] - Get Care Now

## 2023-02-09 DIAGNOSIS — L658 Other specified nonscarring hair loss: Secondary | ICD-10-CM | POA: Diagnosis not present

## 2023-02-09 DIAGNOSIS — L650 Telogen effluvium: Secondary | ICD-10-CM | POA: Diagnosis not present

## 2023-03-25 DIAGNOSIS — M9902 Segmental and somatic dysfunction of thoracic region: Secondary | ICD-10-CM | POA: Diagnosis not present

## 2023-03-25 DIAGNOSIS — M9901 Segmental and somatic dysfunction of cervical region: Secondary | ICD-10-CM | POA: Diagnosis not present

## 2023-03-25 DIAGNOSIS — M62830 Muscle spasm of back: Secondary | ICD-10-CM | POA: Diagnosis not present

## 2023-03-25 DIAGNOSIS — M542 Cervicalgia: Secondary | ICD-10-CM | POA: Diagnosis not present

## 2023-03-30 DIAGNOSIS — L650 Telogen effluvium: Secondary | ICD-10-CM | POA: Diagnosis not present

## 2023-03-30 DIAGNOSIS — L658 Other specified nonscarring hair loss: Secondary | ICD-10-CM | POA: Diagnosis not present

## 2023-03-30 DIAGNOSIS — E559 Vitamin D deficiency, unspecified: Secondary | ICD-10-CM | POA: Diagnosis not present

## 2023-04-01 ENCOUNTER — Ambulatory Visit (HOSPITAL_COMMUNITY)
Admission: EM | Admit: 2023-04-01 | Discharge: 2023-04-01 | Disposition: A | Discharge status: Home / Self Care | Attending: Internal Medicine | Admitting: Internal Medicine

## 2023-04-01 ENCOUNTER — Encounter (HOSPITAL_COMMUNITY): Payer: Self-pay

## 2023-04-01 DIAGNOSIS — R634 Abnormal weight loss: Secondary | ICD-10-CM | POA: Diagnosis not present

## 2023-04-01 NOTE — ED Triage Notes (Signed)
 Patient here today with c/o weight loss over the past few weeks. Patient states that she has been feeling lighter and not sure why she is losing weight. Patient states that it is not normal for her to lose weight. Patient has also had some loss of appetite.

## 2023-04-01 NOTE — Discharge Instructions (Addendum)
 Weight loss, medical screening normal  -Discussed measuring weight at the same time each day with food intake and before any exercise. -Discussed healthy eating habits and continuing to maintain low sugar and carbohydrate intake. -Patient advised to continue monitoring symptoms for any drastic weight changes or any new onset of symptoms to follow-up for further evaluation and management. -Advised to follow-up with a primary care provider as scheduled for routine health maintenance. -Outside laboratory tests reviewed with the patient, TSH, CBC, CMP all within range. -Discussed symptoms and characteristics of hyperthyroidism for patient to wear for any changes in current symptomatology.

## 2023-04-01 NOTE — ED Provider Notes (Signed)
 [facility] Welcome  Note:  This document was prepared using Dragon voice recognition software and may include unintentional dictation errors.  MRN: [id number] DOB: [birth year]  Subjective:   Daisy Nunez is a 23 y.o. female presenting for recent weight loss approximately 5 pounds over the last few weeks.  Patient denies any change in activity level but states that she has been trying to eat more healthy and decrease sugar intake.  Patient is concerned there may be some underlying health issue that is causing her problems.  Patient was seen by her dermatologist for minor hair loss TSH routine labs were checked all within normal limits.  Patient just is concerned and wanted reassurance.  Discussed with patient if there is any concern for possible HIV, patient reports being sexually active with 1 partner no significant concern.  Patient has routine STI testing and routine labs with primary care provider.  Patient has no other medical concerns at this time.  Patient denies any shortness of breath, chest pain, weakness, dizziness, abdominal pain, nausea or vomiting, diarrhea.  No current facility-administered medications for this encounter.  Current Outpatient Medications:    betamethasone, augmented, (DIPROLENE) 0.05 % lotion, Apply topically daily as needed., Disp: , Rfl:    clindamycin (CLEOCIN T) 1 % lotion, APPLY TO FACE AND BACK NIGHTLY, Disp: , Rfl:    cyclobenzaprine (FLEXERIL) 10 MG tablet, Take 1 tablet (10 mg total) by mouth at bedtime as needed for muscle spasms., Disp: 10 tablet, Rfl: 0   ketoconazole (NIZORAL) 2 % shampoo, PLEASE SEE ATTACHED FOR DETAILED DIRECTIONS, Disp: , Rfl:    PREVIDENT 5000 BOOSTER PLUS 1.1 % PSTE, Take 5,000 Bottles by mouth 1 day or 1 dose., Disp: , Rfl:    Vitamin D, Ergocalciferol, (DRISDOL) 1.25 MG (50000 UNIT) CAPS capsule, Take 50,000 Units by mouth once a week., Disp: , Rfl:    No Known Allergies  Past Medical History:  Diagnosis Date   ADD  (attention deficit disorder)    ADHD (attention deficit hyperactivity disorder)      History reviewed. No pertinent surgical history.  Family History  Problem Relation Age of Onset   Cancer Mother    Anxiety disorder Mother    Hypertension Father    Cancer Maternal Grandmother    Alcohol abuse Maternal Grandfather     Social History   Tobacco Use   Smoking status: Never  Vaping Use   Vaping status: Former  Substance Use Topics   Alcohol use: Never   Drug use: Not Currently    ROS Refer to HPI for ROS details.  Objective:   Vitals: BP 134/75 (BP Location: Right Arm)   Pulse 80   Temp 98.5 F (36.9 C) (Oral)   Resp 16   Wt 145 lb 3.2 oz (65.9 kg)   LMP 03/13/2023 (Approximate)   SpO2 98%   BMI 25.72 kg/m   Physical Exam Vitals and nursing note reviewed.  Constitutional:      General: She is not in acute distress.    Appearance: Normal appearance. She is well-developed and normal weight. She is not ill-appearing or toxic-appearing.  HENT:     Head: Normocephalic.  Cardiovascular:     Rate and Rhythm: Normal rate.  Pulmonary:     Effort: Pulmonary effort is normal. No respiratory distress.  Musculoskeletal:     Cervical back: Neck supple.  Skin:    General: Skin is warm and dry.     Capillary Refill: Capillary refill takes less than 2 seconds.  Neurological:     General: No focal deficit present.     Mental Status: She is alert and oriented to person, place, and time.  Psychiatric:        Mood and Affect: Mood normal.        Behavior: Behavior normal.        Thought Content: Thought content normal.        Judgment: Judgment normal.     Procedures  No results found for this or any previous visit (from the past 24 hours).  Assessment and Plan :   PDMP not reviewed this encounter.  1. Loss of weight    Weight loss, medical screening normal  -Discussed measuring weight at the same time each day with food intake and before any  exercise. -Discussed healthy eating habits and continuing to maintain low sugar and carbohydrate intake. -Patient advised to continue monitoring symptoms for any drastic weight changes or any new onset of symptoms to follow-up for further evaluation and management. -Advised to follow-up with a primary care provider as scheduled for routine health maintenance. -Outside laboratory tests reviewed with the patient, TSH, CBC, CMP all within range. -Discussed symptoms and characteristics of hyperthyroidism for patient to wear for any changes in current symptomatology.  Daisy Nunez   Daisy Nunez, [name], [place] 04/01/23 1226

## 2023-04-02 ENCOUNTER — Encounter: Payer: Self-pay | Admitting: Family Medicine

## 2023-04-09 ENCOUNTER — Telehealth: Payer: Self-pay

## 2023-04-09 NOTE — Telephone Encounter (Signed)
 Copied from CRM [id number]. Topic: Appointments - Scheduling Inquiry for Clinic >> Apr 09, 2023  9:45 AM Shon Hale wrote: Reason for CRM: Patient is needing some tests/vaccines for school. Patient inquiring if she could get the following done at physical scheduled for 04/22/2023.   Negative tb test - can be the quantiferon gold  Hep B Vaccine Varicella Vaccine TB Booster  Flu Vaccine  Please assist patient further

## 2023-04-12 DIAGNOSIS — M25561 Pain in right knee: Secondary | ICD-10-CM | POA: Diagnosis not present

## 2023-04-12 DIAGNOSIS — M76891 Other specified enthesopathies of right lower limb, excluding foot: Secondary | ICD-10-CM | POA: Diagnosis not present

## 2023-04-13 ENCOUNTER — Encounter: Payer: Self-pay | Admitting: Family Medicine

## 2023-04-13 ENCOUNTER — Ambulatory Visit (INDEPENDENT_AMBULATORY_CARE_PROVIDER_SITE_OTHER): Admitting: Family Medicine

## 2023-04-13 VITALS — BP 116/76 | HR 62 | Temp 98.1°F | Resp 12 | Ht 63.0 in | Wt 145.2 lb

## 2023-04-13 DIAGNOSIS — R634 Abnormal weight loss: Secondary | ICD-10-CM | POA: Insufficient documentation

## 2023-04-13 DIAGNOSIS — G8929 Other chronic pain: Secondary | ICD-10-CM | POA: Insufficient documentation

## 2023-04-13 DIAGNOSIS — M25561 Pain in right knee: Secondary | ICD-10-CM | POA: Diagnosis not present

## 2023-04-13 NOTE — Progress Notes (Signed)
 Subjective:  HPI: Daisy Nunez is a 23 y.o. female presenting on 04/13/2023 for Weight Loss   HPI Patient requested to be seen today initially for concerns about weight loss, however she reports her weight has stabilized and she believes it was due to dietary changes. She is consuming 1600-2000 calories and is intermittently fasting for 16 hours daily. 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 Her weight has fluctuated from 150 lbs in December to 145 lbs this month and as low as 140 pounds. She is concerned because her weight has been historically around 150 pounds. She does endorse less of an appetite. Denies changes in stool pattern, hematochezia, melena, fever, abdominal pain, night sweats, fatigue.  She is also concerned today about 2.5 months of right knee pain. No known trauma or injury. She does yoga regularly and noted worsening pain last night. She also notes popping when going up stairs. She cannot fully flex her knee. Has been using lidocaine patches.Cherlynn Perches some mild swelling below her patella last night that is improved this morning. Denies redness or warmth. Would like referral to [facility].  Review of Systems  All other systems reviewed and are negative.   Relevant past medical history reviewed and updated as indicated.   Past Medical History:  Diagnosis Date   ADD (attention deficit disorder)    ADHD (attention deficit hyperactivity disorder)      History reviewed. No pertinent surgical history.  Allergies and medications reviewed and updated.   Current Outpatient Medications:    betamethasone, augmented, (DIPROLENE) 0.05 % lotion, Apply topically daily as needed., Disp: , Rfl:    clindamycin (CLEOCIN T) 1 % lotion, APPLY TO FACE AND BACK NIGHTLY, Disp: , Rfl:    cyclobenzaprine (FLEXERIL) 10 MG tablet, Take 1 tablet (10 mg total) by mouth at bedtime as needed for muscle spasms., Disp: 10 tablet, Rfl: 0   ketoconazole (NIZORAL) 2 %  shampoo, PLEASE SEE ATTACHED FOR DETAILED DIRECTIONS, Disp: , Rfl:    PREVIDENT 5000 BOOSTER PLUS 1.1 % PSTE, Take 5,000 Bottles by mouth 1 day or 1 dose., Disp: , Rfl:    Vitamin D, Ergocalciferol, (DRISDOL) 1.25 MG (50000 UNIT) CAPS capsule, Take 50,000 Units by mouth once a week., Disp: , Rfl:   No Known Allergies  Objective:   BP 116/76 (BP Location: Right Arm, Patient Position: Sitting, Cuff Size: Normal)   Pulse 62   Temp 98.1 F (36.7 C) (Oral)   Resp 12   Ht 5\' 3"  (1.6 m)   Wt 145 lb 3.2 oz (65.9 kg)   LMP 03/13/2023 (Approximate)   SpO2 96%   BMI 25.72 kg/m      04/13/2023    8:07 AM 04/01/2023   11:33 AM 04/01/2023   11:29 AM  Vitals with BMI  Height 5\' 3"     Weight 145 lbs 3 oz  145 lbs 3 oz  BMI 25.73    Systolic 116 134   Diastolic 76 75   Pulse 62 80      Physical Exam Vitals and nursing note reviewed.  Constitutional:      Appearance: Normal appearance. She is normal weight.  HENT:     Head: Normocephalic and atraumatic.  Cardiovascular:     Rate and Rhythm: Normal rate and regular rhythm.     Pulses: Normal pulses.     Heart sounds: Normal heart sounds.  Pulmonary:     Effort: Pulmonary effort is normal.  Breath sounds: Normal breath sounds.  Musculoskeletal:     Right knee: Crepitus present. No swelling, effusion, erythema, ecchymosis or bony tenderness. Decreased range of motion.     Instability Tests: Anterior drawer test negative. Posterior drawer test negative. Medial McMurray test negative and lateral McMurray test negative.  Lymphadenopathy:     Cervical: No cervical adenopathy.  Skin:    General: Skin is warm and dry.  Neurological:     General: No focal deficit present.     Mental Status: She is alert and oriented to person, place, and time. Mental status is at baseline.  Psychiatric:        Mood and Affect: Mood normal.        Behavior: Behavior normal.        Thought Content: Thought content normal.        Judgment: Judgment  normal.     Assessment & Plan:  Chronic pain of right knee Assessment & Plan: No instability, swelling, redness, or warmth on exam. She does have limited flexion due to pain as well as crepitus to lateral patella. She would like to proceed to [facility] for evaluation. Informed her this is an ortho urgent care and she does not need a referral. Offered order for x-ray. Encouraged to continue rest, bracing, ice, elevation, NSAIDs.   Recent weight loss Assessment & Plan: Patient reports recent 10 pound weight loss. On exam she is 5 pounds lighter than at her OV 3 months ago. No red flags on exam. She is eating a healthy diet and physically active. Weight fluctuation likely attributed to changes in her diet and intermittent fasting as well as increase in physical activity at work. Encouraged to continue to monitor and return to office if weight loss persists or worsens.      Follow up plan: Return if symptoms worsen or fail to improve.  Park Meo, FNP

## 2023-04-13 NOTE — Assessment & Plan Note (Signed)
 No instability, swelling, redness, or warmth on exam. She does have limited flexion due to pain as well as crepitus to lateral patella. She would like to proceed to [facility] for evaluation. Informed her this is an ortho urgent care and she does not need a referral. Offered order for x-ray. Encouraged to continue rest, bracing, ice, elevation, NSAIDs.

## 2023-04-13 NOTE — Assessment & Plan Note (Signed)
 Patient reports recent 10 pound weight loss. On exam she is 5 pounds lighter than at her OV 3 months ago. No red flags on exam. She is eating a healthy diet and physically active. Weight fluctuation likely attributed to changes in her diet and intermittent fasting as well as increase in physical activity at work. Encouraged to continue to monitor and return to office if weight loss persists or worsens.

## 2023-04-22 ENCOUNTER — Encounter: Admitting: Family Medicine

## 2023-04-22 DIAGNOSIS — M9901 Segmental and somatic dysfunction of cervical region: Secondary | ICD-10-CM | POA: Diagnosis not present

## 2023-04-22 DIAGNOSIS — M542 Cervicalgia: Secondary | ICD-10-CM | POA: Diagnosis not present

## 2023-04-22 DIAGNOSIS — M9902 Segmental and somatic dysfunction of thoracic region: Secondary | ICD-10-CM | POA: Diagnosis not present

## 2023-04-22 DIAGNOSIS — M62830 Muscle spasm of back: Secondary | ICD-10-CM | POA: Diagnosis not present

## 2023-04-26 DIAGNOSIS — M25561 Pain in right knee: Secondary | ICD-10-CM | POA: Diagnosis not present

## 2023-04-27 ENCOUNTER — Encounter: Payer: 59 | Admitting: Family Medicine

## 2023-04-29 DIAGNOSIS — L650 Telogen effluvium: Secondary | ICD-10-CM | POA: Diagnosis not present

## 2023-04-29 DIAGNOSIS — L658 Other specified nonscarring hair loss: Secondary | ICD-10-CM | POA: Diagnosis not present

## 2023-04-29 DIAGNOSIS — L700 Acne vulgaris: Secondary | ICD-10-CM | POA: Diagnosis not present

## 2023-04-29 DIAGNOSIS — E559 Vitamin D deficiency, unspecified: Secondary | ICD-10-CM | POA: Diagnosis not present

## 2023-04-30 ENCOUNTER — Encounter: Payer: Self-pay | Admitting: Family Medicine

## 2023-04-30 ENCOUNTER — Ambulatory Visit (INDEPENDENT_AMBULATORY_CARE_PROVIDER_SITE_OTHER): Admitting: Family Medicine

## 2023-04-30 VITALS — BP 116/62 | HR 79 | Temp 98.1°F | Ht 63.0 in | Wt 143.5 lb

## 2023-04-30 DIAGNOSIS — E559 Vitamin D deficiency, unspecified: Secondary | ICD-10-CM

## 2023-04-30 DIAGNOSIS — L659 Nonscarring hair loss, unspecified: Secondary | ICD-10-CM

## 2023-04-30 DIAGNOSIS — Z0001 Encounter for general adult medical examination with abnormal findings: Secondary | ICD-10-CM | POA: Diagnosis not present

## 2023-04-30 DIAGNOSIS — Z0000 Encounter for general adult medical examination without abnormal findings: Secondary | ICD-10-CM

## 2023-04-30 DIAGNOSIS — R5383 Other fatigue: Secondary | ICD-10-CM

## 2023-04-30 NOTE — Progress Notes (Signed)
 Subjective:    Patient ID: Daisy Nunez, female    DOB: 2000/08/28, 23 y.o.   MRN: [id number]  HPI  Patient is a very pleasant 23 year old female here today for a complete physical exam.  Her biggest concern is hair loss.  She states that this has been going on now for over a year.  It initially started when she had very tight braids put in her hair.  However after that, she had scalp pain that persists to this day.  She started losing hair in an androgenic pattern on the top of her head.  She saw dermatology and they performed to separate scalp biopsies.  One showed possible autoimmune disease.  She states that it showed inflammation.  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  She is working with a Armed forces operational officer at [employer].  They have suggested that we check for autoimmune diseases.  She denies any joint pains.  She does complain of some fatigue.  She denies any hirsutism.  She denies any abnormal periods.  There are no signs of hyperandrogenism.  She denies any fevers or chills.  Family history is concerning for a mother who died from breast cancer in her early 7s.  The patient plans to start mammograms at age 46.  She is scheduled to see a gynecologist soon to have her first Pap smear.  Otherwise she is doing well and no concerns.  She does have a history of vitamin D deficiency and has completed 6 months of vitamin D Past Medical History:  Diagnosis Date   ADD (attention deficit disorder)    ADHD (attention deficit hyperactivity disorder)    No past surgical history on file. Current Outpatient Medications on File Prior to Visit  Medication Sig Dispense Refill   betamethasone, augmented, (DIPROLENE) 0.05 % lotion Apply topically daily as needed.     clindamycin (CLEOCIN T) 1 % lotion APPLY TO FACE AND BACK NIGHTLY     cyclobenzaprine (FLEXERIL) 10 MG tablet Take 1 tablet (10 mg total) by mouth at bedtime as needed for muscle spasms. 10 tablet 0   ketoconazole (NIZORAL) 2 %  shampoo PLEASE SEE ATTACHED FOR DETAILED DIRECTIONS     PREVIDENT 5000 BOOSTER PLUS 1.1 % PSTE Take 5,000 Bottles by mouth 1 day or 1 dose.     Vitamin D, Ergocalciferol, (DRISDOL) 1.25 MG (50000 UNIT) CAPS capsule Take 50,000 Units by mouth once a week.     No current facility-administered medications on file prior to visit.   No Known Allergies Social History   Socioeconomic History   Marital status: Single    Spouse name: Not on file   Number of children: Not on file   Years of education: Not on file   Highest education level: Not on file  Occupational History   Not on file  Tobacco Use   Smoking status: Never   Smokeless tobacco: Not on file  Vaping Use   Vaping status: Former  Substance and Sexual Activity   Alcohol use: Never   Drug use: Not Currently   Sexual activity: Yes  Other Topics Concern   Not on file  Social History Narrative   Not on file   Social Drivers of Health   Financial Resource Strain: Not on file  Food Insecurity: Not on file  Transportation Needs: Not on file  Physical Activity: Not on file  Stress: Not on file  Social Connections: Not on file  Intimate Partner Violence: Not on file   Family History  Problem Relation Age of Onset   Cancer Mother    Anxiety disorder Mother    Hypertension Father    Cancer Maternal Grandmother    Alcohol abuse Maternal Grandfather      Review of Systems     Objective:   Physical Exam Vitals reviewed.  Constitutional:      General: She is not in acute distress.    Appearance: Normal appearance. She is normal weight. She is not ill-appearing or toxic-appearing.  HENT:     Head: Normocephalic and atraumatic.     Right Ear: Tympanic membrane and ear canal normal.     Left Ear: Tympanic membrane and ear canal normal.     Nose: Nose normal. No congestion or rhinorrhea.     Mouth/Throat:     Pharynx: No oropharyngeal exudate or posterior oropharyngeal erythema.  Eyes:     Conjunctiva/sclera:  Conjunctivae normal.     Pupils: Pupils are equal, round, and reactive to light.  Neck:     Vascular: No carotid bruit.  Cardiovascular:     Rate and Rhythm: Normal rate and regular rhythm.     Pulses: Normal pulses.     Heart sounds: Normal heart sounds.  Pulmonary:     Effort: Pulmonary effort is normal. No respiratory distress.     Breath sounds: Normal breath sounds. No stridor. No wheezing, rhonchi or rales.  Abdominal:     General: Bowel sounds are normal. There is no distension.     Palpations: Abdomen is soft.     Tenderness: There is no abdominal tenderness. There is no guarding or rebound.     Hernia: No hernia is present.  Musculoskeletal:     Cervical back: Normal range of motion. No rigidity or tenderness.  Lymphadenopathy:     Cervical: No cervical adenopathy.  Skin:    Findings: No lesion or rash.  Neurological:     General: No focal deficit present.     Mental Status: She is alert and oriented to person, place, and time. Mental status is at baseline.     Motor: No weakness.     Coordination: Coordination normal.     Gait: Gait normal.           Assessment & Plan:  Hair loss - Plan: Sedimentation rate, C-reactive protein, ANA, COMPLETE METABOLIC PANEL WITHOUT GFR, VITAMIN D 25 Hydroxy (Vit-D Deficiency, Fractures)  Vitamin D deficiency - Plan: VITAMIN D 25 Hydroxy (Vit-D Deficiency, Fractures)  Fatigue, unspecified type - Plan: Sedimentation rate, C-reactive protein, ANA, COMPLETE METABOLIC PANEL WITHOUT GFR, VITAMIN D 25 Hydroxy (Vit-D Deficiency, Fractures)  General medical exam  Given her history of vitamin D deficiency I will check a vitamin D level.  Once above 30 I would recommend taking vitamin D 2000 units a day to maintain.  Patient states that her dermatologist checked a TSH and a CBC.  Therefore I will check a CMP, a sedimentation rate, CRP, and an ANA.  If these labs were normal, I feel that this would likely rule out an underlying autoimmune  disease given her lack of other systemic symptoms at the present time.  If these are normal, would consider minoxidil versus finasteride.

## 2023-05-03 LAB — COMPLETE METABOLIC PANEL WITHOUT GFR
AG Ratio: 2.2 (calc) (ref 1.0–2.5)
ALT: 11 U/L (ref 6–29)
AST: 14 U/L (ref 10–30)
Albumin: 4.8 g/dL (ref 3.6–5.1)
Alkaline phosphatase (APISO): 44 U/L (ref 31–125)
BUN: 12 mg/dL (ref 7–25)
CO2: 25 mmol/L (ref 20–32)
Calcium: 9.4 mg/dL (ref 8.6–10.2)
Chloride: 102 mmol/L (ref 98–110)
Creat: 0.74 mg/dL (ref 0.50–0.96)
Globulin: 2.2 g/dL (ref 1.9–3.7)
Glucose, Bld: 76 mg/dL (ref 65–99)
Potassium: 4.5 mmol/L (ref 3.5–5.3)
Sodium: 137 mmol/L (ref 135–146)
Total Bilirubin: 0.7 mg/dL (ref 0.2–1.2)
Total Protein: 7 g/dL (ref 6.1–8.1)

## 2023-05-03 LAB — ANA: Anti Nuclear Antibody (ANA): POSITIVE — AB

## 2023-05-03 LAB — C-REACTIVE PROTEIN: CRP: <3 mg/L (ref <8.0)

## 2023-05-03 LAB — SEDIMENTATION RATE: Sed Rate: 2 mm/h (ref 0–20)

## 2023-05-03 LAB — VITAMIN D 25 HYDROXY (VIT D DEFICIENCY, FRACTURES): Vit D, 25-Hydroxy: 75 ng/mL (ref 30–100)

## 2023-05-03 LAB — ANTI-NUCLEAR AB-TITER (ANA TITER): ANA Titer 1: 1:40 {titer} — ABNORMAL HIGH

## 2023-05-04 ENCOUNTER — Telehealth: Payer: Self-pay

## 2023-05-04 DIAGNOSIS — M62830 Muscle spasm of back: Secondary | ICD-10-CM | POA: Diagnosis not present

## 2023-05-04 DIAGNOSIS — M542 Cervicalgia: Secondary | ICD-10-CM | POA: Diagnosis not present

## 2023-05-04 DIAGNOSIS — M9901 Segmental and somatic dysfunction of cervical region: Secondary | ICD-10-CM | POA: Diagnosis not present

## 2023-05-04 DIAGNOSIS — M9902 Segmental and somatic dysfunction of thoracic region: Secondary | ICD-10-CM | POA: Diagnosis not present

## 2023-05-04 NOTE — Telephone Encounter (Signed)
 Copied from CRM [id number]. Topic: Clinical - Lab/Test Results >> May 04, 2023  1:12 PM Daisy Nunez wrote: Reason for CRM: Pt states she was speaking with Dr, Caren Macadam CMA about her recent lab results and she was messaging her through my chart but there seems to be some confusion. Pt is requesting a callback

## 2023-05-06 DIAGNOSIS — M25561 Pain in right knee: Secondary | ICD-10-CM | POA: Diagnosis not present

## 2023-05-17 DIAGNOSIS — M25561 Pain in right knee: Secondary | ICD-10-CM | POA: Diagnosis not present

## 2023-05-19 ENCOUNTER — Ambulatory Visit: Payer: 59 | Admitting: Dermatology

## 2023-05-21 ENCOUNTER — Other Ambulatory Visit: Payer: Self-pay | Admitting: Family Medicine

## 2023-05-24 DIAGNOSIS — M25561 Pain in right knee: Secondary | ICD-10-CM | POA: Diagnosis not present

## 2023-05-25 ENCOUNTER — Encounter: Payer: Self-pay | Admitting: Family Medicine

## 2023-05-25 ENCOUNTER — Ambulatory Visit (INDEPENDENT_AMBULATORY_CARE_PROVIDER_SITE_OTHER): Admitting: Family Medicine

## 2023-05-25 VITALS — BP 120/62 | HR 95 | Ht 63.0 in | Wt 141.0 lb

## 2023-05-25 DIAGNOSIS — M542 Cervicalgia: Secondary | ICD-10-CM | POA: Diagnosis not present

## 2023-05-25 DIAGNOSIS — M9902 Segmental and somatic dysfunction of thoracic region: Secondary | ICD-10-CM | POA: Diagnosis not present

## 2023-05-25 DIAGNOSIS — Z113 Encounter for screening for infections with a predominantly sexual mode of transmission: Secondary | ICD-10-CM | POA: Diagnosis not present

## 2023-05-25 DIAGNOSIS — M62830 Muscle spasm of back: Secondary | ICD-10-CM | POA: Diagnosis not present

## 2023-05-25 DIAGNOSIS — M9901 Segmental and somatic dysfunction of cervical region: Secondary | ICD-10-CM | POA: Diagnosis not present

## 2023-05-25 NOTE — Progress Notes (Signed)
 Subjective:    Patient ID: Daisy Nunez, female    DOB: 03/07/2000, 23 y.o.   MRN: [id number]  HPI  Patient presents today requesting STD testing.  She denies any rash.  She denies any discharge.  She denies any abdominal pain.  She does report fecal urgency for the last month ever since she has been placed doxycycline for hair loss by her neurologist.  She denies any nausea vomiting.  She denies any melena or hematochezia.  Patient states that she now is going to the bathroom 2-3 times a day with urgency whereas before she would go once every other day Past Medical History:  Diagnosis Date   ADD (attention deficit disorder)    ADHD (attention deficit hyperactivity disorder)    No past surgical history on file. Current Outpatient Medications on File Prior to Visit  Medication Sig Dispense Refill   betamethasone, augmented, (DIPROLENE) 0.05 % lotion Apply topically daily as needed.     clindamycin (CLEOCIN T) 1 % lotion APPLY TO FACE AND BACK NIGHTLY     cyclobenzaprine  (FLEXERIL ) 10 MG tablet Take 1 tablet (10 mg total) by mouth at bedtime as needed for muscle spasms. 10 tablet 0   ketoconazole (NIZORAL) 2 % shampoo PLEASE SEE ATTACHED FOR DETAILED DIRECTIONS     PREVIDENT 5000 BOOSTER PLUS 1.1 % PSTE Take 5,000 Bottles by mouth 1 day or 1 dose.     Vitamin D , Ergocalciferol , (DRISDOL) 1.25 MG (50000 UNIT) CAPS capsule Take 50,000 Units by mouth once a week.     No current facility-administered medications on file prior to visit.   No Known Allergies Social History   Socioeconomic History   Marital status: Single    Spouse name: Not on file   Number of children: Not on file   Years of education: Not on file   Highest education level: Not on file  Occupational History   Not on file  Tobacco Use   Smoking status: Never   Smokeless tobacco: Not on file  Vaping Use   Vaping status: Former  Substance and Sexual Activity   Alcohol use: Never   Drug use: Not Currently    Sexual activity: Yes  Other Topics Concern   Not on file  Social History Narrative   Not on file   Social Drivers of Health   Financial Resource Strain: Not on file  Food Insecurity: Not on file  Transportation Needs: Not on file  Physical Activity: Not on file  Stress: Not on file  Social Connections: Not on file  Intimate Partner Violence: Not on file   Family History  Problem Relation Age of Onset   Cancer Mother    Anxiety disorder Mother    Hypertension Father    Cancer Maternal Grandmother    Alcohol abuse Maternal Grandfather      Review of Systems     Objective:   Physical Exam Vitals reviewed.  Constitutional:      General: She is not in acute distress.    Appearance: Normal appearance. She is normal weight. She is not ill-appearing or toxic-appearing.  HENT:     Head: Normocephalic and atraumatic.  Cardiovascular:     Rate and Rhythm: Normal rate and regular rhythm.     Pulses: Normal pulses.     Heart sounds: Normal heart sounds.  Pulmonary:     Effort: Pulmonary effort is normal. No respiratory distress.     Breath sounds: Normal breath sounds. No stridor. No wheezing, rhonchi or  rales.  Abdominal:     General: Bowel sounds are normal. There is no distension.     Palpations: Abdomen is soft.     Tenderness: There is no abdominal tenderness. There is no guarding or rebound.     Hernia: No hernia is present.  Neurological:     Mental Status: She is alert.           Assessment & Plan:  Screening examination for STD (sexually transmitted disease) - Plan: C. TRACHOMATIS/N. GONORRHOEAE RNA ([facility]), HIV Antibody (routine testing w rflx), RPR Check urine sample for gonorrhea and chlamydia.  Check blood sample for RPR and HIV.  Patient denies any itching or burning to suggest trichomonas or rash to suggest herpes.  Recommended trying adding a probiotic to help with fecal urgency as I suspect that the addition of doxycycline has triggered this

## 2023-05-26 LAB — C. TRACHOMATIS/N. GONORRHOEAE RNA
C. trachomatis RNA, TMA: NOT DETECTED
N. gonorrhoeae RNA, TMA: NOT DETECTED

## 2023-05-26 LAB — HIV ANTIBODY (ROUTINE TESTING W REFLEX): HIV 1&2 Ab, 4th Generation: NONREACTIVE

## 2023-05-26 LAB — RPR: RPR Ser Ql: NONREACTIVE

## 2023-05-28 DIAGNOSIS — M25561 Pain in right knee: Secondary | ICD-10-CM | POA: Diagnosis not present

## 2023-06-01 ENCOUNTER — Telehealth: Admitting: Family Medicine

## 2023-06-01 DIAGNOSIS — L989 Disorder of the skin and subcutaneous tissue, unspecified: Secondary | ICD-10-CM | POA: Diagnosis not present

## 2023-06-01 MED ORDER — DOXYCYCLINE HYCLATE 20 MG PO TABS: 20.0000 mg | ORAL_TABLET | Freq: Two times a day (BID) | ORAL | 0 refills | Status: AC

## 2023-06-01 NOTE — Progress Notes (Signed)
 Virtual Visit Consent   Daisy Nunez, you are scheduled for a virtual visit with a [facility] provider today. Just as with appointments in the office, your consent must be obtained to participate. Your consent will be active for this visit and any virtual visit you may have with one of our providers in the next 365 days. If you have a MyChart account, a copy of this consent can be sent to you electronically.  As this is a virtual visit, video technology does not allow for your provider to perform a traditional examination. This may limit your provider's ability to fully assess your condition. If your provider identifies any concerns that need to be evaluated in person or the need to arrange testing (such as labs, EKG, etc.), we will make arrangements to do so. Although advances in technology are sophisticated, we cannot ensure that it will always work on either your end or our end. If the connection with a video visit is poor, the visit may have to be switched to a telephone visit. With either a video or telephone visit, we are not always able to ensure that we have a secure connection.  By engaging in this virtual visit, you consent to the provision of healthcare and authorize for your insurance to be billed (if applicable) for the services provided during this visit. Depending on your insurance coverage, you may receive a charge related to this service.  I need to obtain your verbal consent now. Are you willing to proceed with your visit today? Daisy Nunez has provided verbal consent on 06/01/2023 for a virtual visit (video or telephone). Albertha Huger, FNP  Date: 06/01/2023 4:38 PM   Virtual Visit via Video Note   I, Albertha Huger, connected with  Daisy Nunez  ([id number], 06/24/2000) on 06/01/23 at  4:45 PM EDT by a video-enabled telemedicine application and verified that I am speaking with the correct person using two identifiers.  Location: Patient: Virtual Visit Location Patient:  Home Provider: Virtual Visit Location Provider: Home Office   I discussed the limitations of evaluation and management by telemedicine and the availability of in person appointments. The patient expressed understanding and agreed to proceed.    History of Present Illness: Daisy Nunez is a 23 y.o. who identifies as a female who was assigned female at birth, and is being seen today for refill on doxycycline. She has a follow up Friday with derm but is concerned about running out of the med. She says this is the first time in a year her scalp has not been painful and irritated. 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   Encounter to establish care with new doctor 08/27/2022   Cardiac murmur 08/27/2022   Pharyngitis 05/29/2021   Attention deficit hyperactivity disorder, combined type 03/04/2015    Allergies: No Known Allergies Medications:  Current Outpatient Medications:    doxycycline (PERIOSTAT) 20 MG tablet, Take 1 tablet (20 mg total) by mouth 2 (two) times daily for 7 days., Disp: 14 tablet, Rfl: 0   betamethasone, augmented, (DIPROLENE) 0.05 % lotion, Apply topically daily as needed., Disp: , Rfl:    clindamycin (CLEOCIN T) 1 % lotion, APPLY TO FACE AND BACK NIGHTLY, Disp: , Rfl:    cyclobenzaprine  (FLEXERIL ) 10 MG tablet, Take 1 tablet (10 mg total) by mouth at bedtime as needed for muscle  spasms., Disp: 10 tablet, Rfl: 0   ketoconazole (NIZORAL) 2 % shampoo, PLEASE SEE ATTACHED FOR DETAILED DIRECTIONS, Disp: , Rfl:    PREVIDENT 5000 BOOSTER PLUS 1.1 % PSTE, Take 5,000 Bottles by mouth 1 day or 1 dose., Disp: , Rfl:    Vitamin D , Ergocalciferol , (DRISDOL) 1.25 MG (50000 UNIT) CAPS capsule, Take 50,000 Units by mouth once a week., Disp: , Rfl:   Observations/Objective: Patient is well-developed, well-nourished in no acute distress.   Resting comfortably  at home.  Head is normocephalic, atraumatic.  No labored breathing.  Speech is clear and coherent with logical content.  Patient is alert and oriented at baseline.    Assessment and Plan: 1. Disorder of scalp (Primary)  Extended doxycycline 20 mg bid for 1 week, follow up with derm.   Follow Up Instructions: I discussed the assessment and treatment plan with the patient. The patient was provided an opportunity to ask questions and all were answered. The patient agreed with the plan and demonstrated an understanding of the instructions.  A copy of instructions were sent to the patient via MyChart unless otherwise noted below.     The patient was advised to call back or seek an in-person evaluation if the symptoms worsen or if the condition fails to improve as anticipated.    [name], FNP

## 2023-06-01 NOTE — Patient Instructions (Signed)
 Rash, Adult A rash is a breakout of spots or blotches on the skin. It can affect the way the skin looks and feels. Many things can cause a rash. Common causes include: Viral infections. These include colds, measles, and hand, foot, and mouth disease. Bacterial infections. These include scarlet fever and impetigo. Fungal infections. These include athlete's foot, ringworm, and yeast rashes. Skin irritation. This may be from heat rash, exposure to moisture or friction for a long time (intertrigo), or exposure to soap or skin care products (eczema). Allergic reactions. These may be caused by foods, medicines, or things like poison ivy. Some rashes may go away after a few days. Others may last for a few weeks. The goal of treatment is to stop the itching and keep the rash from spreading. Follow these instructions at home: Medicine Take or apply over-the-counter and prescription medicines only as told by your health care provider. These may include: Corticosteroids. These can help treat red or swollen skin. They may be given as creams or as medicines to take by mouth (orally). Anti-itch lotions. Allergy medicines. Pain medicine. Antifungal medicine if the rash is from a fungal infection. Antibiotics if you have an infection.  Skin care Apply cool, wet cloths (compresses) to the affected areas. Do not scratch or rub your skin. Avoid covering the rash. Keep it exposed to air as often as you can. Managing itching and discomfort Avoid hot showers and baths. These can make itching worse. A cold shower may help. Try taking a bath with: Epsom salts. You can get these at your local pharmacy or grocery store. Follow the instructions on the package. Baking soda. Pour a small amount into the bath as told by your provider. Colloidal oatmeal. You can get this at your local pharmacy or grocery store. Follow the instructions on the package. Try putting baking soda paste on your skin. Stir water into baking  soda until it becomes like a paste. Try using calamine lotion or cortisone cream to help with itchiness. Keep cool. Stay out of the sun. Sweating and being hot can make itching worse. General instructions  Rest as needed. Drink enough fluid to keep your pee (urine) pale yellow. Wear loose-fitting clothes. Avoid scented soaps, detergents, and perfumes. Use gentle soaps, detergents, perfumes, and cosmetics. Avoid the things that cause your rash (triggers). Keep a journal to help keep track of your triggers. Write down: What you eat. What cosmetics you use. What you drink. What you wear. This includes jewelry. Contact a health care provider if: You sweat at night more than normal. You pee (urinate) more or less than normal, or your pee is a darker color than normal. Your eyes become sensitive to light. Your skin or the white parts of your eyes turn yellow (jaundice). Your skin tingles or is numb. You get painful blisters in your nose or mouth. Your rash does not go away after a few days, or it gets worse. You are more tired or thirsty than normal. You have new or worse symptoms. These may include: Pain in your abdomen. Fever. Diarrhea or vomiting. Weakness or weight loss. Get help right away if: You get confused. You have a severe headache, a stiff neck, or severe joint pain or stiffness. You become very sleepy or not responsive. You have a seizure. This information is not intended to replace advice given to you by your health care provider. Make sure you discuss any questions you have with your health care provider. Document Revised: 10/31/2021 Document Reviewed:  10/31/2021 Elsevier Patient Education  2024 [employer].

## 2023-06-04 DIAGNOSIS — L659 Nonscarring hair loss, unspecified: Secondary | ICD-10-CM | POA: Diagnosis not present

## 2023-06-04 DIAGNOSIS — E559 Vitamin D deficiency, unspecified: Secondary | ICD-10-CM | POA: Diagnosis not present

## 2023-06-04 DIAGNOSIS — L650 Telogen effluvium: Secondary | ICD-10-CM | POA: Diagnosis not present

## 2023-06-10 DIAGNOSIS — M25561 Pain in right knee: Secondary | ICD-10-CM | POA: Diagnosis not present

## 2023-06-15 DIAGNOSIS — M542 Cervicalgia: Secondary | ICD-10-CM | POA: Diagnosis not present

## 2023-06-15 DIAGNOSIS — M9901 Segmental and somatic dysfunction of cervical region: Secondary | ICD-10-CM | POA: Diagnosis not present

## 2023-06-15 DIAGNOSIS — M62830 Muscle spasm of back: Secondary | ICD-10-CM | POA: Diagnosis not present

## 2023-06-15 DIAGNOSIS — M9902 Segmental and somatic dysfunction of thoracic region: Secondary | ICD-10-CM | POA: Diagnosis not present

## 2023-06-24 DIAGNOSIS — M25561 Pain in right knee: Secondary | ICD-10-CM | POA: Diagnosis not present

## 2023-06-24 DIAGNOSIS — Z124 Encounter for screening for malignant neoplasm of cervix: Secondary | ICD-10-CM | POA: Diagnosis not present

## 2023-06-24 DIAGNOSIS — G8929 Other chronic pain: Secondary | ICD-10-CM | POA: Diagnosis not present

## 2023-06-29 DIAGNOSIS — L650 Telogen effluvium: Secondary | ICD-10-CM | POA: Diagnosis not present

## 2023-06-29 DIAGNOSIS — L659 Nonscarring hair loss, unspecified: Secondary | ICD-10-CM | POA: Diagnosis not present

## 2023-06-29 DIAGNOSIS — E559 Vitamin D deficiency, unspecified: Secondary | ICD-10-CM | POA: Diagnosis not present

## 2023-06-29 DIAGNOSIS — Z79899 Other long term (current) drug therapy: Secondary | ICD-10-CM | POA: Diagnosis not present

## 2023-07-06 DIAGNOSIS — M9901 Segmental and somatic dysfunction of cervical region: Secondary | ICD-10-CM | POA: Diagnosis not present

## 2023-07-06 DIAGNOSIS — M9902 Segmental and somatic dysfunction of thoracic region: Secondary | ICD-10-CM | POA: Diagnosis not present

## 2023-07-06 DIAGNOSIS — M62830 Muscle spasm of back: Secondary | ICD-10-CM | POA: Diagnosis not present

## 2023-07-06 DIAGNOSIS — M542 Cervicalgia: Secondary | ICD-10-CM | POA: Diagnosis not present

## 2023-07-13 ENCOUNTER — Other Ambulatory Visit: Payer: Self-pay

## 2023-07-13 ENCOUNTER — Emergency Department (HOSPITAL_COMMUNITY)
Admission: EM | Admit: 2023-07-13 | Discharge: 2023-07-13 | Disposition: A | Discharge status: Home / Self Care | Attending: Emergency Medicine | Admitting: Emergency Medicine

## 2023-07-13 ENCOUNTER — Encounter (HOSPITAL_COMMUNITY): Payer: Self-pay

## 2023-07-13 DIAGNOSIS — S0990XA Unspecified injury of head, initial encounter: Secondary | ICD-10-CM | POA: Diagnosis not present

## 2023-07-13 DIAGNOSIS — Y9383 Activity, rough housing and horseplay: Secondary | ICD-10-CM | POA: Insufficient documentation

## 2023-07-13 MED ORDER — IBUPROFEN 400 MG PO TABS
600.0000 mg | ORAL_TABLET | Freq: Once | ORAL | Status: AC
  Administered 2023-07-13: 600 mg via ORAL
  Filled 2023-07-13: qty 1
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
## 2023-07-13 NOTE — Discharge Instructions (Addendum)
 Evaluation for your head injury is overall reassuring.  It is believed may have sustained a mild concussion.  Please treat conservatively at home with rest, assertive hydration and ibuprofen  and Tylenol as needed.  Please follow-up with your PCP.  If you have any focal weakness or numbness in your extremities, facial droop, worsening headache, visual disturbance or any other concerning symptom please return to the ED for further evaluation.

## 2023-07-15 ENCOUNTER — Encounter: Payer: Self-pay | Admitting: Family Medicine

## 2023-07-15 ENCOUNTER — Telehealth: Payer: Self-pay

## 2023-07-15 ENCOUNTER — Ambulatory Visit (INDEPENDENT_AMBULATORY_CARE_PROVIDER_SITE_OTHER): Admitting: Family Medicine

## 2023-07-15 VITALS — BP 100/68 | HR 85 | Temp 96.7°F | Ht 63.0 in | Wt 146.0 lb

## 2023-07-15 DIAGNOSIS — M9901 Segmental and somatic dysfunction of cervical region: Secondary | ICD-10-CM | POA: Diagnosis not present

## 2023-07-15 DIAGNOSIS — M9902 Segmental and somatic dysfunction of thoracic region: Secondary | ICD-10-CM | POA: Diagnosis not present

## 2023-07-15 DIAGNOSIS — M62830 Muscle spasm of back: Secondary | ICD-10-CM | POA: Diagnosis not present

## 2023-07-15 DIAGNOSIS — S0990XD Unspecified injury of head, subsequent encounter: Secondary | ICD-10-CM | POA: Insufficient documentation

## 2023-07-15 DIAGNOSIS — M542 Cervicalgia: Secondary | ICD-10-CM | POA: Diagnosis not present
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
## 2023-07-15 NOTE — Telephone Encounter (Signed)
 Copied from CRM [id number]. Topic: Clinical - Medical Advice >> Jul 15, 2023 11:09 AM Marissa P wrote: Reason for CRM: Patient is requesting a doctors note in mychart incase she needs to leave early today please

## 2023-07-16 ENCOUNTER — Ambulatory Visit
Admission: RE | Admit: 2023-07-16 | Discharge: 2023-07-16 | Disposition: A | Discharge status: Home / Self Care | Source: Ambulatory Visit | Attending: Family Medicine | Admitting: Family Medicine

## 2023-07-16 DIAGNOSIS — S0990XD Unspecified injury of head, subsequent encounter: Secondary | ICD-10-CM

## 2023-07-16 DIAGNOSIS — S0990XA Unspecified injury of head, initial encounter: Secondary | ICD-10-CM | POA: Diagnosis not present

## 2023-07-19 ENCOUNTER — Ambulatory Visit: Payer: Self-pay | Admitting: Family Medicine

## 2023-07-22 DIAGNOSIS — M62830 Muscle spasm of back: Secondary | ICD-10-CM | POA: Diagnosis not present

## 2023-07-22 DIAGNOSIS — M9902 Segmental and somatic dysfunction of thoracic region: Secondary | ICD-10-CM | POA: Diagnosis not present

## 2023-07-22 DIAGNOSIS — M9901 Segmental and somatic dysfunction of cervical region: Secondary | ICD-10-CM | POA: Diagnosis not present

## 2023-07-22 DIAGNOSIS — M542 Cervicalgia: Secondary | ICD-10-CM | POA: Diagnosis not present

## 2023-07-27 DIAGNOSIS — M25561 Pain in right knee: Secondary | ICD-10-CM | POA: Diagnosis not present

## 2023-07-28 DIAGNOSIS — N644 Mastodynia: Secondary | ICD-10-CM | POA: Diagnosis not present

## 2023-08-03 ENCOUNTER — Ambulatory Visit: Admitting: Family Medicine

## 2023-08-09 DIAGNOSIS — M542 Cervicalgia: Secondary | ICD-10-CM | POA: Diagnosis not present

## 2023-08-09 DIAGNOSIS — M9902 Segmental and somatic dysfunction of thoracic region: Secondary | ICD-10-CM | POA: Diagnosis not present

## 2023-08-09 DIAGNOSIS — M9901 Segmental and somatic dysfunction of cervical region: Secondary | ICD-10-CM | POA: Diagnosis not present

## 2023-08-09 DIAGNOSIS — M62830 Muscle spasm of back: Secondary | ICD-10-CM | POA: Diagnosis not present

## 2023-08-13 DIAGNOSIS — L249 Irritant contact dermatitis, unspecified cause: Secondary | ICD-10-CM | POA: Diagnosis not present

## 2023-08-17 DIAGNOSIS — L309 Dermatitis, unspecified: Secondary | ICD-10-CM | POA: Diagnosis not present

## 2023-08-19 DIAGNOSIS — M9901 Segmental and somatic dysfunction of cervical region: Secondary | ICD-10-CM | POA: Diagnosis not present

## 2023-08-19 DIAGNOSIS — M542 Cervicalgia: Secondary | ICD-10-CM | POA: Diagnosis not present

## 2023-08-19 DIAGNOSIS — M9902 Segmental and somatic dysfunction of thoracic region: Secondary | ICD-10-CM | POA: Diagnosis not present

## 2023-08-19 DIAGNOSIS — M62830 Muscle spasm of back: Secondary | ICD-10-CM | POA: Diagnosis not present

## 2023-08-24 ENCOUNTER — Other Ambulatory Visit: Payer: Self-pay

## 2023-08-24 ENCOUNTER — Ambulatory Visit (HOSPITAL_COMMUNITY): Admission: EM | Admit: 2023-08-24 | Discharge: 2023-08-24 | Disposition: A | Discharge status: Home / Self Care

## 2023-08-24 ENCOUNTER — Encounter (HOSPITAL_COMMUNITY): Payer: Self-pay | Admitting: Emergency Medicine

## 2023-08-24 DIAGNOSIS — Z23 Encounter for immunization: Secondary | ICD-10-CM | POA: Diagnosis not present

## 2023-08-24 DIAGNOSIS — M79674 Pain in right toe(s): Secondary | ICD-10-CM | POA: Diagnosis not present

## 2023-08-24 DIAGNOSIS — Z5321 Procedure and treatment not carried out due to patient leaving prior to being seen by health care provider: Secondary | ICD-10-CM

## 2023-08-24 NOTE — ED Triage Notes (Signed)
 Pt states she go a dog bite one hour pta. She states is unknown dog. Pt has the bite on the right bottom with some bleeding present.

## 2023-08-24 NOTE — ED Provider Notes (Signed)
 I did not see or evaluate this patient.  They left before I had a chance to examine or talk to them.    Melonie Locus, PA-C 08/24/23 1527

## 2023-08-25 DIAGNOSIS — M62830 Muscle spasm of back: Secondary | ICD-10-CM | POA: Diagnosis not present

## 2023-08-25 DIAGNOSIS — M542 Cervicalgia: Secondary | ICD-10-CM | POA: Diagnosis not present

## 2023-08-25 DIAGNOSIS — M9901 Segmental and somatic dysfunction of cervical region: Secondary | ICD-10-CM | POA: Diagnosis not present

## 2023-08-25 DIAGNOSIS — M9902 Segmental and somatic dysfunction of thoracic region: Secondary | ICD-10-CM | POA: Diagnosis not present

## 2023-08-31 DIAGNOSIS — L650 Telogen effluvium: Secondary | ICD-10-CM | POA: Diagnosis not present

## 2023-08-31 DIAGNOSIS — E559 Vitamin D deficiency, unspecified: Secondary | ICD-10-CM | POA: Diagnosis not present

## 2023-08-31 DIAGNOSIS — L659 Nonscarring hair loss, unspecified: Secondary | ICD-10-CM | POA: Diagnosis not present

## 2023-09-02 DIAGNOSIS — M9901 Segmental and somatic dysfunction of cervical region: Secondary | ICD-10-CM | POA: Diagnosis not present

## 2023-09-02 DIAGNOSIS — M9902 Segmental and somatic dysfunction of thoracic region: Secondary | ICD-10-CM | POA: Diagnosis not present

## 2023-09-02 DIAGNOSIS — M542 Cervicalgia: Secondary | ICD-10-CM | POA: Diagnosis not present

## 2023-09-02 DIAGNOSIS — M62830 Muscle spasm of back: Secondary | ICD-10-CM | POA: Diagnosis not present

## 2023-09-09 DIAGNOSIS — N644 Mastodynia: Secondary | ICD-10-CM | POA: Diagnosis not present

## 2023-09-09 DIAGNOSIS — Z803 Family history of malignant neoplasm of breast: Secondary | ICD-10-CM | POA: Diagnosis not present

## 2023-09-12 DIAGNOSIS — R0981 Nasal congestion: Secondary | ICD-10-CM | POA: Diagnosis not present

## 2023-09-16 DIAGNOSIS — M9902 Segmental and somatic dysfunction of thoracic region: Secondary | ICD-10-CM | POA: Diagnosis not present

## 2023-09-16 DIAGNOSIS — M62830 Muscle spasm of back: Secondary | ICD-10-CM | POA: Diagnosis not present

## 2023-09-16 DIAGNOSIS — M9901 Segmental and somatic dysfunction of cervical region: Secondary | ICD-10-CM | POA: Diagnosis not present

## 2023-09-16 DIAGNOSIS — M542 Cervicalgia: Secondary | ICD-10-CM | POA: Diagnosis not present

## 2023-09-22 DIAGNOSIS — M542 Cervicalgia: Secondary | ICD-10-CM | POA: Diagnosis not present

## 2023-09-22 DIAGNOSIS — M9902 Segmental and somatic dysfunction of thoracic region: Secondary | ICD-10-CM | POA: Diagnosis not present

## 2023-09-22 DIAGNOSIS — M9901 Segmental and somatic dysfunction of cervical region: Secondary | ICD-10-CM | POA: Diagnosis not present

## 2023-09-22 DIAGNOSIS — M62830 Muscle spasm of back: Secondary | ICD-10-CM | POA: Diagnosis not present

## 2023-09-23 ENCOUNTER — Telehealth

## 2023-09-23 DIAGNOSIS — Z79899 Other long term (current) drug therapy: Secondary | ICD-10-CM | POA: Diagnosis not present

## 2023-09-23 DIAGNOSIS — L988 Other specified disorders of the skin and subcutaneous tissue: Secondary | ICD-10-CM | POA: Diagnosis not present

## 2023-10-18 ENCOUNTER — Telehealth: Payer: Self-pay

## 2023-10-18 DIAGNOSIS — M62830 Muscle spasm of back: Secondary | ICD-10-CM | POA: Diagnosis not present

## 2023-10-18 DIAGNOSIS — M542 Cervicalgia: Secondary | ICD-10-CM | POA: Diagnosis not present

## 2023-10-18 DIAGNOSIS — M9902 Segmental and somatic dysfunction of thoracic region: Secondary | ICD-10-CM | POA: Diagnosis not present

## 2023-10-18 DIAGNOSIS — M9901 Segmental and somatic dysfunction of cervical region: Secondary | ICD-10-CM | POA: Diagnosis not present

## 2023-10-18 NOTE — Telephone Encounter (Signed)
 Copied from CRM [id number]. Topic: Clinical - Request for Lab/Test Order >> Oct 18, 2023  9:32 AM Harlene ORN wrote: Reason for CRM: Patient is calling to request orders for STD testing. Please call back the patient to confirm orders and schedule.

## 2023-10-19 ENCOUNTER — Encounter: Payer: Self-pay | Admitting: Family Medicine

## 2023-10-19 ENCOUNTER — Ambulatory Visit: Admitting: Family Medicine

## 2023-10-19 VITALS — BP 110/71 | HR 82 | Temp 97.7°F | Ht 63.0 in | Wt 153.0 lb

## 2023-10-19 DIAGNOSIS — Z113 Encounter for screening for infections with a predominantly sexual mode of transmission: Secondary | ICD-10-CM | POA: Insufficient documentation

## 2023-10-19 NOTE — Assessment & Plan Note (Signed)
 Counseled on safe sex practices and pregnancy prevention. Routine STI screening ordered.

## 2023-10-19 NOTE — Progress Notes (Signed)
 Subjective:  HPI: Daisy Nunez is a 23 y.o. female presenting on 10/19/2023 for Acute Visit (STD testing /Has changed partners and wants testing to be safe. )   HPI Patient is in today for routine STD testing, she does have a new sexual partner. No known exposures or symptoms. No history of STI. Is not using protection with her new partner. Declines contraceptive, is using cycle tracking as prevention. Advised on risks of unprotected intercourse.  Review of Systems  All other systems reviewed and are negative.   Relevant past medical history reviewed and updated as indicated.   Past Medical History:  Diagnosis Date   ADD (attention deficit disorder)    ADHD (attention deficit hyperactivity disorder)      History reviewed. No pertinent surgical history.  Allergies and medications reviewed and updated.   Current Outpatient Medications:    cyclobenzaprine  (FLEXERIL ) 10 MG tablet, Take 1 tablet (10 mg total) by mouth at bedtime as needed for muscle spasms., Disp: 10 tablet, Rfl: 0   doxycycline  (ADOXA) 50 MG tablet, Take 50 mg by mouth 2 (two) times daily., Disp: , Rfl:    hydroxychloroquine (PLAQUENIL) 200 MG tablet, Take 200 mg by mouth 3 (three) times daily., Disp: , Rfl:    ketoconazole (NIZORAL) 2 % shampoo, PLEASE SEE ATTACHED FOR DETAILED DIRECTIONS, Disp: , Rfl:    PREVIDENT 5000 BOOSTER PLUS 1.1 % PSTE, Take 5,000 Bottles by mouth 1 day or 1 dose., Disp: , Rfl:    Vitamin D , Ergocalciferol , (DRISDOL) 1.25 MG (50000 UNIT) CAPS capsule, Take 50,000 Units by mouth once a week., Disp: , Rfl:   No Known Allergies  Objective:   BP 110/71   Pulse 82   Temp 97.7 F (36.5 C)   Ht 5' 3 (1.6 m)   Wt 153 lb (69.4 kg)   LMP 10/02/2023 (Exact Date)   SpO2 99%   BMI 27.10 kg/m      10/19/2023    9:39 AM 08/24/2023    2:54 PM 07/15/2023    9:45 AM  Vitals with BMI  Height 5' 3  5' 3  Weight 153 lbs  146 lbs  BMI 27.11  25.87  Systolic 110 119 899  Diastolic 71 79  68  Pulse 82 73 85     Physical Exam Vitals and nursing note reviewed.  Constitutional:      Appearance: Normal appearance. She is normal weight.  HENT:     Head: Normocephalic and atraumatic.  Skin:    General: Skin is warm and dry.  Neurological:     General: No focal deficit present.     Mental Status: She is alert and oriented to person, place, and time. Mental status is at baseline.  Psychiatric:        Mood and Affect: Mood normal.        Behavior: Behavior normal.        Thought Content: Thought content normal.        Judgment: Judgment normal.     Assessment & Plan:  Screening examination for STD (sexually transmitted disease) Assessment & Plan: Counseled on safe sex practices and pregnancy prevention. Routine STI screening ordered.   Orders: -     C. trachomatis/N. gonorrhoeae RNA -     HIV Antibody (routine testing w rflx) -     HSV(herpes simplex vrs) 1+2 ab-IgG -     RPR -     Hepatitis Panel (REFL)     Follow up plan: Return if  symptoms worsen or fail to improve.  Jeoffrey GORMAN Barrio, FNP

## 2023-10-20 ENCOUNTER — Ambulatory Visit: Payer: Self-pay | Admitting: Family Medicine

## 2023-10-20 LAB — C. TRACHOMATIS/N. GONORRHOEAE RNA
C. trachomatis RNA, TMA: NOT DETECTED
N. gonorrhoeae RNA, TMA: NOT DETECTED

## 2023-10-21 LAB — HEPATITIS PANEL(REFL)
HEPATITIS C ANTIBODY REFILL$(REFL): NONREACTIVE
Hep B Core Total Ab: NONREACTIVE
Hep B S Ab: REACTIVE — AB
Hepatitis A AB,Total: REACTIVE — AB
Hepatitis B Surface Ag: NONREACTIVE

## 2023-10-21 LAB — REFLEX TIQ

## 2023-10-21 LAB — HIV ANTIBODY (ROUTINE TESTING W REFLEX)
HIV 1&2 Ab, 4th Generation: NONREACTIVE
HIV FINAL INTERPRETATION: NEGATIVE

## 2023-10-21 LAB — HERPES SIMPLEX VIRUS 1 AND 2 (IGG),REFLEX HSV-2 INHIBITION
HSV 1 IGG,TYPE SPECIFIC AB: <0.9 {index}
HSV 2 IGG,TYPE SPECIFIC AB: <0.9 {index}

## 2023-10-21 LAB — RPR: RPR Ser Ql: NONREACTIVE

## 2023-10-26 DIAGNOSIS — L300 Nummular dermatitis: Secondary | ICD-10-CM | POA: Diagnosis not present

## 2023-10-27 DIAGNOSIS — M9902 Segmental and somatic dysfunction of thoracic region: Secondary | ICD-10-CM | POA: Diagnosis not present

## 2023-10-27 DIAGNOSIS — M542 Cervicalgia: Secondary | ICD-10-CM | POA: Diagnosis not present

## 2023-10-27 DIAGNOSIS — M62830 Muscle spasm of back: Secondary | ICD-10-CM | POA: Diagnosis not present

## 2023-10-27 DIAGNOSIS — M9901 Segmental and somatic dysfunction of cervical region: Secondary | ICD-10-CM | POA: Diagnosis not present

## 2023-11-01 ENCOUNTER — Encounter: Payer: Self-pay | Admitting: Family Medicine

## 2023-11-01 ENCOUNTER — Ambulatory Visit (INDEPENDENT_AMBULATORY_CARE_PROVIDER_SITE_OTHER): Admitting: Family Medicine

## 2023-11-01 ENCOUNTER — Ambulatory Visit: Payer: Self-pay | Admitting: Family Medicine

## 2023-11-01 VITALS — BP 122/80 | HR 72 | Temp 97.8°F | Ht 63.0 in | Wt 152.6 lb

## 2023-11-01 DIAGNOSIS — G8929 Other chronic pain: Secondary | ICD-10-CM

## 2023-11-01 DIAGNOSIS — N898 Other specified noninflammatory disorders of vagina: Secondary | ICD-10-CM | POA: Diagnosis not present

## 2023-11-01 DIAGNOSIS — Z0001 Encounter for general adult medical examination with abnormal findings: Secondary | ICD-10-CM | POA: Diagnosis not present

## 2023-11-01 DIAGNOSIS — M25561 Pain in right knee: Secondary | ICD-10-CM

## 2023-11-01 DIAGNOSIS — E559 Vitamin D deficiency, unspecified: Secondary | ICD-10-CM | POA: Diagnosis not present

## 2023-11-01 DIAGNOSIS — M25562 Pain in left knee: Secondary | ICD-10-CM | POA: Diagnosis not present

## 2023-11-01 DIAGNOSIS — L669 Cicatricial alopecia, unspecified: Secondary | ICD-10-CM | POA: Diagnosis not present

## 2023-11-01 DIAGNOSIS — M79662 Pain in left lower leg: Secondary | ICD-10-CM | POA: Diagnosis not present

## 2023-11-01 DIAGNOSIS — Z0000 Encounter for general adult medical examination without abnormal findings: Secondary | ICD-10-CM | POA: Insufficient documentation

## 2023-11-01 LAB — WET PREP FOR TRICH, YEAST, CLUE

## 2023-11-01 NOTE — Assessment & Plan Note (Signed)

## 2023-11-01 NOTE — Assessment & Plan Note (Signed)
Followed by Daisy Nunez Continue Plaquenil

## 2023-11-01 NOTE — Assessment & Plan Note (Signed)
 Wet prep positive for clue cells and bacterial. Will treat with Flagyl 500mg  BID x7d. Follow up PRN

## 2023-11-01 NOTE — Progress Notes (Signed)
 Complete physical exam  Patient: Daisy Nunez   DOB: 2000/12/26   23 y.o. Female  MRN: [id number]  Subjective:    Chief Complaint  Patient presents with   Medical Management of Chronic Issues    6 months f/u  Possible yeast infection x 3 days  Wants Vitamin D  levels checked     Daisy Nunez is a 23 y.o. female who presents today for a complete physical exam. She reports consuming a general diet. The patient has a physically strenuous job, but has no regular exercise apart from work.  She generally feels well. She reports sleeping well. She does have additional problems to discuss today. She is experiencing several days of vaginal itching. Denies discharge, rash, abnormal uterine bleeding, dysuria. PAP with GYN, mammograms will start age 75 due to family history.  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 0  Number falls in past yr: 0  Injury with Fall? 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 No pertinent surgical history. Social History   Tobacco Use   Smoking status: Never  Vaping Use   Vaping status: Former  Substance Use Topics   Alcohol use: Never   Drug use: Not Currently   Family History  Problem Relation Age of Onset   Cancer Mother    Anxiety disorder Mother    Hypertension Father    Cancer Maternal Grandmother    Alcohol abuse Maternal Grandfather    No Known Allergies    Patient Care Team: Kayla Jeoffrey RAMAN, FNP as PCP - General (Family Medicine)   Outpatient Medications Prior to Visit  Medication Sig   cholecalciferol (VITAMIN D3) 25 MCG (1000 UNIT) tablet Take 1,000 Units by mouth daily.   doxycycline  (ADOXA) 50 MG tablet Take 50 mg by mouth 2 (two) times daily.   hydroxychloroquine (PLAQUENIL) 200 MG tablet Take 200 mg by mouth 3 (three) times daily.   ketoconazole (NIZORAL) 2 % shampoo PLEASE SEE ATTACHED FOR DETAILED DIRECTIONS   PREVIDENT 5000 BOOSTER PLUS 1.1 % PSTE Take 5,000 Bottles by mouth 1 day or 1 dose.   [DISCONTINUED] cyclobenzaprine  (FLEXERIL ) 10 MG tablet Take 1 tablet (10 mg total) by mouth at bedtime as needed for muscle spasms.   [DISCONTINUED] Vitamin D , Ergocalciferol , (DRISDOL) 1.25 MG (50000 UNIT) CAPS capsule Take 50,000 Units by mouth once a week.  No facility-administered medications prior to visit.    Review of Systems  Constitutional: Negative.   HENT: Negative.    Eyes: Negative.   Respiratory: Negative.    Cardiovascular: Negative.   Gastrointestinal: Negative.   Genitourinary: Negative.   Musculoskeletal:  Positive for joint pain.  Skin:  Positive for itching.  Neurological: Negative.   Endo/Heme/Allergies: Negative.   Psychiatric/Behavioral: Negative.    All other systems reviewed and are negative.         Objective:     BP 122/80   Pulse 72   Temp 97.8 F (36.6 C)   Ht 5' 3 (1.6 m)   Wt 152 lb 9.6 oz (69.2 kg)   LMP 10/25/2023 (Exact Date)   SpO2 99%   BMI 27.03 kg/m  BP Readings  from Last 3 Encounters:  11/01/23 122/80  10/19/23 110/71  08/24/23 119/79   Wt Readings from Last 3 Encounters:  11/01/23 152 lb 9.6 oz (69.2 kg)  10/19/23 153 lb (69.4 kg)  07/15/23 146 lb (66.2 kg)      Physical Exam Vitals and nursing note reviewed.  Constitutional:      Appearance: Normal appearance. She is normal weight.  HENT:     Head: Normocephalic and atraumatic.     Right Ear: Tympanic membrane, ear canal and external ear normal.     Left Ear: Tympanic membrane, ear canal and external ear normal.     Nose: Nose normal.     Mouth/Throat:     Mouth: Mucous membranes are moist.     Pharynx: Oropharynx is clear.  Eyes:     Extraocular Movements: Extraocular movements intact.     Conjunctiva/sclera: Conjunctivae normal.     Pupils: Pupils are equal, round, and reactive to light.  Cardiovascular:     Rate and Rhythm: Normal rate and regular rhythm.     Pulses: Normal pulses.     Heart sounds: Normal heart sounds.  Pulmonary:     Effort: Pulmonary effort is normal.     Breath sounds: Normal breath sounds.  Abdominal:     General: Bowel sounds are normal.     Palpations: Abdomen is soft.  Musculoskeletal:        General: Normal range of motion.     Cervical back: Normal range of motion and neck supple.  Skin:    General: Skin is warm and dry.     Capillary Refill: Capillary refill takes less than 2 seconds.  Neurological:     General: No focal deficit present.     Mental Status: She is alert and oriented to person, place, and time. Mental status is at baseline.  Psychiatric:        Mood and Affect: Mood normal.        Behavior: Behavior normal.        Thought Content: Thought content normal.        Judgment: Judgment normal.      No results found for any visits on 11/01/23. Last CBC Lab Results  Component Value Date   WBC 6.1 02/05/2023   HGB 13.1 02/05/2023   HCT 41.1 02/05/2023   MCV 88 02/05/2023   MCH 28.0 02/05/2023   RDW 11.9 02/05/2023   PLT  251 02/05/2023   Last metabolic panel Lab Results  Component Value Date   GLUCOSE 76 04/30/2023   NA 137 04/30/2023   K 4.5 04/30/2023   CL 102 04/30/2023   CO2 25 04/30/2023   BUN 12 04/30/2023  CREATININE 0.74 04/30/2023   EGFR 127 02/05/2023   CALCIUM 9.4 04/30/2023   PROT 7.0 04/30/2023   ALBUMIN 4.8 02/05/2023   LABGLOB 2.1 02/05/2023   BILITOT 0.7 04/30/2023   ALKPHOS 59 02/05/2023   AST 14 04/30/2023   ALT 11 04/30/2023   Last lipids No results found for: CHOL, HDL, LDLCALC, LDLDIRECT, TRIG, CHOLHDL Last hemoglobin A1c No results found for: HGBA1C Last thyroid  functions Lab Results  Component Value Date   TSH 0.633 02/05/2023   Last vitamin D  Lab Results  Component Value Date   VD25OH 75 04/30/2023   Last vitamin B12 and Folate No results found for: VITAMINB12, FOLATE      Assessment & Plan:    Routine Health Maintenance and Physical Exam  Immunization History  Administered Date(s) Administered   DTaP 11/11/2001, 01/10/2002, 03/13/2002, 12/28/2002, 05/24/2006   HIB (PRP-T) 11/11/2001, 01/10/2002, 03/13/2002, 12/28/2002   HPV Quadrivalent 11/04/2012, 11/06/2013, 11/06/2014   Hepatitis A, Ped/Adol-2 Dose 09/15/2004, 05/24/2006   Hepatitis B, PED/ADOLESCENT [birth year], 2001, 10/13/2001, 03/13/2002   IPV 11/11/2001, 01/10/2002, 09/12/2002, 05/24/2006   Influenza Nasal 10/30/2010   Influenza Split 11/26/2011, 11/04/2012, 11/06/2013, 11/06/2014   Influenza,inj,Quad PF,6+ Mos 03/24/2018   Influenza,inj,Quad PF,6-35 Mos 12/28/2002   Influenza,inj,quad, With Preservative 01/29/2003   MMR 09/12/2002, 05/24/2006   Meningococcal Conjugate 11/04/2012   Moderna Sars-Covid-2 Vaccination 10/19/2020, 11/17/2020   PPD Test 09/04/2022   Pneumococcal Conjugate-13 11/11/2001, 01/10/2002, 03/13/2002, 04/12/2003   Tdap 11/26/2011, 08/24/2023   Varicella 09/12/2002, 05/24/2006    Health Maintenance  Topic Date Due   Influenza Vaccine  11/10/2023  (Originally 08/27/2023)   Cervical Cancer Screening (Pap smear)  11/24/2023 (Originally 09/11/2022)   Hepatitis C Screening  05/24/2024 (Originally 09/11/2019)   Meningococcal B Vaccine (1 of 2 - Standard) 05/24/2024 (Originally 09/10/2017)   CHLAMYDIA SCREENING  10/18/2024   DTaP/Tdap/Td (8 - Td or Tdap) 08/23/2033   Pneumococcal Vaccine  Completed   Hepatitis B Vaccines 19-59 Average Risk  Completed   HPV VACCINES  Completed   HIV Screening  Completed   COVID-19 Vaccine  Discontinued    Discussed health benefits of physical activity, and encouraged her to engage in regular exercise appropriate for her age and condition.  Problem List Items Addressed This Visit     Cicatricial alopecia   Followed by Elita. Continue Plaquenil.      Chronic pain of right knee   Followed by Ortho      Physical exam, annual - Primary   Today your medical history was reviewed and routine physical exam with labs was performed. Recommend 150 minutes of moderate intensity exercise weekly and consuming a well-balanced diet. Advised to stop smoking if a smoker, avoid smoking if a non-smoker, limit alcohol consumption to 1 drink per day for women and 2 drinks per day for men, and avoid illicit drug use. Counseled on safe sex practices and offered STI testing today. Counseled on the importance of sunscreen use. Counseled in mental health awareness and when to seek medical care. Vaccine maintenance discussed. Appropriate health maintenance items reviewed. Return to office in 1 year for annual physical exam.       Relevant Orders   CBC with Differential/Platelet   Comprehensive metabolic panel with GFR   Lipid panel   VITAMIN D  25 Hydroxy (Vit-D Deficiency, Fractures)   Vitamin D  deficiency   Vitamin D  level today.      Relevant Orders   VITAMIN D  25 Hydroxy (Vit-D Deficiency, Fractures)   Vaginal itching   Wet prep positive  for clue cells and bacterial. Will treat with Flagyl 500mg  BID x7d. Follow up PRN       Relevant Orders   WET PREP FOR TRICH, YEAST, CLUE   Return in about 1 year (around 10/31/2024) for annual physical with labs 1 week prior.     Jeoffrey GORMAN Barrio, FNP

## 2023-11-01 NOTE — Assessment & Plan Note (Signed)
-  Followed by Gaylord Shih

## 2023-11-01 NOTE — Assessment & Plan Note (Signed)
 Vitamin D level today

## 2023-11-02 ENCOUNTER — Other Ambulatory Visit: Payer: Self-pay | Admitting: Family Medicine

## 2023-11-02 ENCOUNTER — Ambulatory Visit: Payer: Self-pay

## 2023-11-02 ENCOUNTER — Telehealth: Payer: Self-pay

## 2023-11-02 LAB — LIPID PANEL
Cholesterol: 145 mg/dL (ref <200)
HDL: 69 mg/dL (ref >=50)
LDL Cholesterol (Calc): 65 mg/dL
Non-HDL Cholesterol (Calc): 76 mg/dL (ref <130)
Total CHOL/HDL Ratio: 2.1 (calc) (ref <5.0)
Triglycerides: 38 mg/dL (ref <150)

## 2023-11-02 LAB — CBC WITH DIFFERENTIAL/PLATELET
Absolute Lymphocytes: 882 {cells}/uL (ref 850–3900)
Absolute Monocytes: 293 {cells}/uL (ref 200–950)
Basophils Absolute: 29 {cells}/uL (ref 0–200)
Basophils Relative: 1 %
Eosinophils Absolute: 61 {cells}/uL (ref 15–500)
Eosinophils Relative: 2.1 %
HCT: 38.6 % (ref 35.0–45.0)
Hemoglobin: 12.1 g/dL (ref 11.7–15.5)
MCH: 26.9 pg — ABNORMAL LOW (ref 27.0–33.0)
MCHC: 31.3 g/dL — ABNORMAL LOW (ref 32.0–36.0)
MCV: 86 fL (ref 80.0–100.0)
MPV: 11.2 fL (ref 7.5–12.5)
Monocytes Relative: 10.1 %
Neutro Abs: 1636 {cells}/uL (ref 1500–7800)
Neutrophils Relative %: 56.4 %
Platelets: 226 Thousand/uL (ref 140–400)
RBC: 4.49 Million/uL (ref 3.80–5.10)
RDW: 11.8 % (ref 11.0–15.0)
Total Lymphocyte: 30.4 %
WBC: 2.9 Thousand/uL — ABNORMAL LOW (ref 3.8–10.8)

## 2023-11-02 LAB — COMPREHENSIVE METABOLIC PANEL WITH GFR
AG Ratio: 2 (calc) (ref 1.0–2.5)
ALT: 15 U/L (ref 6–29)
AST: 14 U/L (ref 10–30)
Albumin: 4.5 g/dL (ref 3.6–5.1)
Alkaline phosphatase (APISO): 47 U/L (ref 31–125)
BUN: 14 mg/dL (ref 7–25)
CO2: 30 mmol/L (ref 20–32)
Calcium: 9.3 mg/dL (ref 8.6–10.2)
Chloride: 103 mmol/L (ref 98–110)
Creat: 0.81 mg/dL (ref 0.50–0.96)
Globulin: 2.2 g/dL (ref 1.9–3.7)
Glucose, Bld: 80 mg/dL (ref 65–99)
Potassium: 4.7 mmol/L (ref 3.5–5.3)
Sodium: 138 mmol/L (ref 135–146)
Total Bilirubin: 0.4 mg/dL (ref 0.2–1.2)
Total Protein: 6.7 g/dL (ref 6.1–8.1)
eGFR: 105 mL/min/1.73m2 (ref >=60)

## 2023-11-02 LAB — VITAMIN D 25 HYDROXY (VIT D DEFICIENCY, FRACTURES): Vit D, 25-Hydroxy: 29 ng/mL — ABNORMAL LOW (ref 30–100)

## 2023-11-02 MED ORDER — METRONIDAZOLE 500 MG PO TABS
500.0000 mg | ORAL_TABLET | Freq: Two times a day (BID) | ORAL | 0 refills | Status: AC
Start: 2023-11-02 — End: 2023-11-09

## 2023-11-02 MED ORDER — VITAMIN D 25 MCG (1000 UNIT) PO TABS: 1000.0000 [IU] | ORAL_TABLET | Freq: Every day | ORAL | 3 refills | Status: AC

## 2023-11-02 NOTE — Telephone Encounter (Signed)
 FYI Only or Action Required?: Action required by provider: Med request.  Patient was last seen in primary care on 11/01/2023 by Kayla Jeoffrey RAMAN, FNP.  Called Nurse Triage reporting Vaginal Itching.  Symptoms began several days ago.  Interventions attempted: Nothing.  Symptoms are: gradually worsening.  Triage Disposition: Information or Advice Only Call  Patient/caregiver understands and will follow disposition?: Yes Reason for Disposition  Health information question, no triage required and triager able to answer question  Answer Assessment - Initial Assessment Questions Patient requesting Flagyl be sent into [facility] pharmacy on file off [address] on file. Call back number [phone]  1. REASON FOR CALL: What is the main reason for your call? or How can I best help you?     Flagyl was supposed to be called into pharmacy yesterday and never was.  2. SYMPTOMS : Do you have any symptoms?      Vaginal itching, uncomfortable  3. OTHER QUESTIONS: Do you have any other questions?     Requesting to be put back on Vitamin D3 due to levels being low in recent labs.  Protocols used: Information Only Call - No Triage-A-AH  Copied from CRM [id number]. Topic: Clinical - Prescription Issue >> Nov 02, 2023  8:55 AM Amy B wrote: Reason for CRM: Patient states she was told at her visit yesterday that she would be getting a prescription of Flagyl 500mg  BID x7d.  This has not been sent to her pharmacy.  She also requests to be put back on cholecalciferol (VITAMIN D3) 25 MCG (1000 UNIT) tablet. >> Nov 02, 2023 10:10 AM Gustabo D wrote: Pt states she is very uncomfortable due to not being able to get her prescription. She called about this previously and it still hasn't been sent.

## 2023-11-02 NOTE — Telephone Encounter (Signed)
 Copied from CRM #[id number]. Topic: Clinical - Prescription Issue >> Nov 02, 2023  8:55 AM Amy B wrote: Reason for CRM: Patient states she was told at her visit yesterday that she would be getting a prescription of Flagyl 500mg  BID x7d.  This has not been sent to her pharmacy.  She also requests to be put back on cholecalciferol (VITAMIN D3) 25 MCG (1000 UNIT) tablet.

## 2023-11-08 DIAGNOSIS — E559 Vitamin D deficiency, unspecified: Secondary | ICD-10-CM | POA: Diagnosis not present

## 2023-11-08 DIAGNOSIS — D72819 Decreased white blood cell count, unspecified: Secondary | ICD-10-CM | POA: Diagnosis not present

## 2023-11-10 DIAGNOSIS — M542 Cervicalgia: Secondary | ICD-10-CM | POA: Diagnosis not present

## 2023-11-10 DIAGNOSIS — M9902 Segmental and somatic dysfunction of thoracic region: Secondary | ICD-10-CM | POA: Diagnosis not present

## 2023-11-10 DIAGNOSIS — M9901 Segmental and somatic dysfunction of cervical region: Secondary | ICD-10-CM | POA: Diagnosis not present

## 2023-11-10 DIAGNOSIS — M62830 Muscle spasm of back: Secondary | ICD-10-CM | POA: Diagnosis not present

## 2023-11-10 DIAGNOSIS — M9904 Segmental and somatic dysfunction of sacral region: Secondary | ICD-10-CM | POA: Diagnosis not present

## 2023-11-10 DIAGNOSIS — M9903 Segmental and somatic dysfunction of lumbar region: Secondary | ICD-10-CM | POA: Diagnosis not present

## 2023-11-10 DIAGNOSIS — M9905 Segmental and somatic dysfunction of pelvic region: Secondary | ICD-10-CM | POA: Diagnosis not present

## 2023-11-10 DIAGNOSIS — M546 Pain in thoracic spine: Secondary | ICD-10-CM | POA: Diagnosis not present

## 2023-11-18 DIAGNOSIS — N760 Acute vaginitis: Secondary | ICD-10-CM | POA: Diagnosis not present

## 2023-11-22 DIAGNOSIS — M9902 Segmental and somatic dysfunction of thoracic region: Secondary | ICD-10-CM | POA: Diagnosis not present

## 2023-11-22 DIAGNOSIS — M546 Pain in thoracic spine: Secondary | ICD-10-CM | POA: Diagnosis not present

## 2023-11-22 DIAGNOSIS — M62830 Muscle spasm of back: Secondary | ICD-10-CM | POA: Diagnosis not present

## 2023-11-22 DIAGNOSIS — M9904 Segmental and somatic dysfunction of sacral region: Secondary | ICD-10-CM | POA: Diagnosis not present

## 2023-11-22 DIAGNOSIS — M9905 Segmental and somatic dysfunction of pelvic region: Secondary | ICD-10-CM | POA: Diagnosis not present

## 2023-11-22 DIAGNOSIS — M9903 Segmental and somatic dysfunction of lumbar region: Secondary | ICD-10-CM | POA: Diagnosis not present

## 2023-11-22 DIAGNOSIS — M9901 Segmental and somatic dysfunction of cervical region: Secondary | ICD-10-CM | POA: Diagnosis not present

## 2023-11-22 DIAGNOSIS — M542 Cervicalgia: Secondary | ICD-10-CM | POA: Diagnosis not present

## 2023-11-29 ENCOUNTER — Ambulatory Visit: Admitting: Family Medicine

## 2023-12-01 DIAGNOSIS — L650 Telogen effluvium: Secondary | ICD-10-CM | POA: Diagnosis not present

## 2023-12-01 DIAGNOSIS — E559 Vitamin D deficiency, unspecified: Secondary | ICD-10-CM | POA: Diagnosis not present

## 2023-12-01 DIAGNOSIS — L659 Nonscarring hair loss, unspecified: Secondary | ICD-10-CM | POA: Diagnosis not present

## 2023-12-02 ENCOUNTER — Ambulatory Visit: Admitting: Family Medicine

## 2023-12-14 ENCOUNTER — Ambulatory Visit: Admitting: Family Medicine

## 2023-12-14 ENCOUNTER — Encounter: Payer: Self-pay | Admitting: Family Medicine

## 2023-12-14 ENCOUNTER — Ambulatory Visit (INDEPENDENT_AMBULATORY_CARE_PROVIDER_SITE_OTHER): Admitting: Family Medicine

## 2023-12-14 VITALS — BP 110/65 | HR 76 | Temp 98.5°F | Ht 60.0 in | Wt 156.6 lb

## 2023-12-14 DIAGNOSIS — K5900 Constipation, unspecified: Secondary | ICD-10-CM

## 2023-12-14 DIAGNOSIS — R7989 Other specified abnormal findings of blood chemistry: Secondary | ICD-10-CM | POA: Diagnosis not present

## 2023-12-14 DIAGNOSIS — D709 Neutropenia, unspecified: Secondary | ICD-10-CM | POA: Diagnosis not present

## 2023-12-14 DIAGNOSIS — E559 Vitamin D deficiency, unspecified: Secondary | ICD-10-CM | POA: Diagnosis not present

## 2023-12-14 NOTE — Progress Notes (Signed)
 Acute Office Visit  Patient ID: Daisy Nunez, female    DOB: Nov 07, 2000, 23 y.o.   MRN: [id number]  PCP: Kayla Jeoffrey RAMAN, FNP  Chief Complaint  Patient presents with   Follow-up    Review white blood count from last labs as well as gastro referral for constipation     Subjective:     HPI  Discussed the use of AI scribe software for clinical note transcription with the patient, who gave verbal consent to proceed.  History of Present Illness Daisy Nunez is a 23 year old female who presents for a recheck of her white blood cell count.  Approximately a month and a half ago, she was found to have a low white blood cell count. At that time, she was not on any new medications or steroids. She recalls having diarrhea two weeks prior to the initial low count, which she associates with eating pizza. She recalls being told she was reactive for hepatitis A and is unsure if her prior vaccine was for hepatitis A or B. She had received a hepatitis vaccine when she started working at [employer], but she is unsure if it was for hepatitis A or B.  For the past month, she has experienced constipation, requiring regular use of Dulcolax. Despite a high-fiber diet and adequate hydration, she continues to have constipation with stools that are sometimes solid and sometimes pebble-like. She denies blood in her stool, black stools, and abdominal pain but notes frequent bloating. She has a family history of colon cancer and is concerned about her symptoms.  She takes vitamin D3 and occasionally vitamin C. She maintains a clean diet and is physically active.   Review of Systems  All other systems reviewed and are negative.   Past Medical History:  Diagnosis Date   ADD (attention deficit disorder)    ADHD (attention deficit hyperactivity disorder)     History reviewed. No pertinent surgical history.  Current Outpatient Medications on File Prior to Visit  Medication Sig Dispense Refill   Vitamin D ,  Ergocalciferol , (DRISDOL) 1.25 MG (50000 UNIT) CAPS capsule Take 50,000 Units by mouth once a week.     cholecalciferol (VITAMIN D3) 25 MCG (1000 UNIT) tablet Take 1 tablet (1,000 Units total) by mouth daily. 90 tablet 3   doxycycline  (ADOXA) 50 MG tablet Take 50 mg by mouth 2 (two) times daily. (Patient not taking: Reported on 12/14/2023)     hydroxychloroquine (PLAQUENIL) 200 MG tablet Take 200 mg by mouth 3 (three) times daily.     ketoconazole (NIZORAL) 2 % shampoo PLEASE SEE ATTACHED FOR DETAILED DIRECTIONS     PREVIDENT 5000 BOOSTER PLUS 1.1 % PSTE Take 5,000 Bottles by mouth 1 day or 1 dose.     No current facility-administered medications on file prior to visit.    No Known Allergies     Objective:    BP 110/65   Pulse 76   Temp 98.5 F (36.9 C)   Ht 5' (1.524 m)   Wt 156 lb 9.6 oz (71 kg)   LMP 12/13/2023 (Approximate)   SpO2 100%   BMI 30.58 kg/m    Physical Exam Vitals and nursing note reviewed.  Constitutional:      Appearance: Normal appearance. She is normal weight.  HENT:     Head: Normocephalic and atraumatic.  Abdominal:     Tenderness: There is no abdominal tenderness.  Skin:    General: Skin is warm and dry.  Neurological:     General:  No focal deficit present.     Mental Status: She is alert and oriented to person, place, and time. Mental status is at baseline.  Psychiatric:        Mood and Affect: Mood normal.        Behavior: Behavior normal.        Thought Content: Thought content normal.        Judgment: Judgment normal.       No results found for any visits on 12/14/23.     Assessment & Plan:   Problem List Items Addressed This Visit     Neutropenia   Relevant Orders   CBC with Differential/Platelet   Low vitamin D  level   Relevant Orders   VITAMIN D  25 Hydroxy (Vit-D Deficiency, Fractures)   Constipation - Primary    Assessment and Plan Assessment & Plan Neutropenia Possibly secondary to recent viral infection, possibly  hepatitis A. No current symptoms of hepatitis A. - Rechecked white blood cell count.  Constipation New onset constipation for 1 month. No blood in stool or abdominal pain, but bloating present. Family history of colon cancer noted. Dulcolax effective. - Consider Miralax  for better stool consistency. - Monitor for abdominal pain or blood in stool. - Consider gastroenterology referral if symptoms persist or worsen, declined today.  Vitamin D  deficiency Managed with vitamin D3 supplementation. - Continue vitamin D3 supplementation.    No orders of the defined types were placed in this encounter.   Return if symptoms worsen or fail to improve.  Jeoffrey GORMAN Barrio, FNP [facility]

## 2023-12-15 ENCOUNTER — Encounter: Payer: Self-pay | Admitting: Family Medicine

## 2023-12-15 LAB — CBC WITH DIFFERENTIAL/PLATELET
Absolute Lymphocytes: 998 {cells}/uL (ref 850–3900)
Absolute Monocytes: 388 {cells}/uL (ref 200–950)
Basophils Absolute: 29 {cells}/uL (ref 0–200)
Basophils Relative: 0.5 %
Eosinophils Absolute: 51 {cells}/uL (ref 15–500)
Eosinophils Relative: 0.9 %
HCT: 37.5 % (ref 35.0–45.0)
Hemoglobin: 12.1 g/dL (ref 11.7–15.5)
MCH: 27.8 pg (ref 27.0–33.0)
MCHC: 32.3 g/dL (ref 32.0–36.0)
MCV: 86 fL (ref 80.0–100.0)
MPV: 11.3 fL (ref 7.5–12.5)
Monocytes Relative: 6.8 %
Neutro Abs: 4235 {cells}/uL (ref 1500–7800)
Neutrophils Relative %: 74.3 %
Platelets: 224 Thousand/uL (ref 140–400)
RBC: 4.36 Million/uL (ref 3.80–5.10)
RDW: 11.6 % (ref 11.0–15.0)
Total Lymphocyte: 17.5 %
WBC: 5.7 Thousand/uL (ref 3.8–10.8)

## 2023-12-15 LAB — VITAMIN D 25 HYDROXY (VIT D DEFICIENCY, FRACTURES): Vit D, 25-Hydroxy: 47 ng/mL (ref 30–100)

## 2023-12-16 ENCOUNTER — Ambulatory Visit: Payer: Self-pay | Admitting: Family Medicine

## 2023-12-22 DIAGNOSIS — M542 Cervicalgia: Secondary | ICD-10-CM | POA: Diagnosis not present

## 2023-12-22 DIAGNOSIS — M9905 Segmental and somatic dysfunction of pelvic region: Secondary | ICD-10-CM | POA: Diagnosis not present

## 2023-12-22 DIAGNOSIS — M546 Pain in thoracic spine: Secondary | ICD-10-CM | POA: Diagnosis not present

## 2023-12-22 DIAGNOSIS — O0480 (Induced) termination of pregnancy with unspecified complications: Secondary | ICD-10-CM | POA: Diagnosis not present

## 2023-12-22 DIAGNOSIS — M62830 Muscle spasm of back: Secondary | ICD-10-CM | POA: Diagnosis not present

## 2023-12-22 DIAGNOSIS — M9902 Segmental and somatic dysfunction of thoracic region: Secondary | ICD-10-CM | POA: Diagnosis not present

## 2023-12-22 DIAGNOSIS — M9904 Segmental and somatic dysfunction of sacral region: Secondary | ICD-10-CM | POA: Diagnosis not present

## 2023-12-22 DIAGNOSIS — M9903 Segmental and somatic dysfunction of lumbar region: Secondary | ICD-10-CM | POA: Diagnosis not present

## 2023-12-22 DIAGNOSIS — M9901 Segmental and somatic dysfunction of cervical region: Secondary | ICD-10-CM | POA: Diagnosis not present

## 2023-12-29 DIAGNOSIS — Z8742 Personal history of other diseases of the female genital tract: Secondary | ICD-10-CM | POA: Diagnosis not present

## 2023-12-29 DIAGNOSIS — N907 Vulvar cyst: Secondary | ICD-10-CM | POA: Diagnosis not present

## 2023-12-30 DIAGNOSIS — R221 Localized swelling, mass and lump, neck: Secondary | ICD-10-CM | POA: Diagnosis not present

## 2023-12-30 DIAGNOSIS — R599 Enlarged lymph nodes, unspecified: Secondary | ICD-10-CM | POA: Diagnosis not present

## 2024-01-04 DIAGNOSIS — L309 Dermatitis, unspecified: Secondary | ICD-10-CM | POA: Diagnosis not present

## 2024-01-04 DIAGNOSIS — L810 Postinflammatory hyperpigmentation: Secondary | ICD-10-CM | POA: Diagnosis not present

## 2024-01-07 ENCOUNTER — Other Ambulatory Visit: Payer: Self-pay

## 2024-01-07 DIAGNOSIS — R221 Localized swelling, mass and lump, neck: Secondary | ICD-10-CM

## 2024-01-10 ENCOUNTER — Encounter: Payer: Self-pay | Admitting: Family Medicine

## 2024-01-24 ENCOUNTER — Ambulatory Visit
Admission: RE | Admit: 2024-01-24 | Discharge: 2024-01-24 | Disposition: A | Discharge status: Home / Self Care | Source: Ambulatory Visit

## 2024-01-24 DIAGNOSIS — R221 Localized swelling, mass and lump, neck: Secondary | ICD-10-CM

## 2024-02-15 ENCOUNTER — Telehealth: Payer: Self-pay

## 2024-02-15 ENCOUNTER — Encounter: Payer: Self-pay | Admitting: Family Medicine

## 2024-02-15 NOTE — Telephone Encounter (Signed)
 DUPLICATE, ORIGINAL MESSAGE HAS BEEN SENT TO [facility]. MJP,LPN  Copied from CRM [id number]. Topic: Clinical - Request for Lab/Test Order >> Feb 15, 2024  9:16 AM Vena HERO wrote: Reason for CRM: Pt requesting a test to be sent to [facility].  HCG to be sent to [facility] [address] [place],  [place]  [address]  [place]  PHONE: [phone] >> Feb 15, 2024  2:18 PM Joesph B wrote: Patient is following up on request, please advise.

## 2024-02-15 NOTE — Telephone Encounter (Signed)
 Copied from CRM [id number]. Topic: Clinical - Request for Lab/Test Order >> Feb 15, 2024  9:16 AM Vena HERO wrote: Reason for CRM: Pt requesting a test to be sent to [facility].  HCG to be sent to [facility] [address] [place],  [place]  [address]  [place]  PHONE: [phone]

## 2024-02-16 ENCOUNTER — Other Ambulatory Visit: Payer: Self-pay | Admitting: Family Medicine

## 2024-02-24 ENCOUNTER — Ambulatory Visit: Payer: Self-pay

## 2024-02-24 NOTE — Telephone Encounter (Signed)
 Pt is self pay and had some questions about out of pocket payment, pt was referred to billing department.   FYI Only or Action Required?: FYI only for provider: appointment scheduled on 1/30.  Patient was last seen in primary care on 12/14/2023 by Kayla Jeoffrey RAMAN, FNP.  Called Nurse Triage reporting [place].  Symptoms began about a month ago.  Interventions attempted: Nothing.  Symptoms are: stable.  Triage Disposition: See PCP When Office is Open (Within 3 Days)  Patient/caregiver understands and will follow disposition?: Yes    Reason for Triage: Swollen lymph node on lower tailbone, has been there for the past month. Lymph node can get tender.    Reason for Disposition  Boil > 1/2 inch across (> 12 mm; larger than a marble)  Answer Assessment - Initial Assessment Questions 1. APPEARANCE of BOIL: What does the boil look like?      It's really only felt  2. LOCATION: Where is the boil located?      Above the butt crack and to the L  3. NUMBER: How many boils are there?      1  4. SIZE: How big is the boil? (e.g., inches, cm; compare to size of a coin or other object)     Probably about the size of a marble  5. ONSET: When did the boil start?     1.5 month ago  6. PAIN: Is there any pain? If Yes, ask: How bad is the pain?   (Scale 1-10; or mild, moderate, severe)     5/10 upon palpation  7. FEVER: Do you have a fever? If Yes, ask: What is it, how was it measured, and when did it start?      Denies  8. SOURCE: Have you been around anyone with boils or other Staph infections? Have you ever had boils before?     Denies  9. OTHER SYMPTOMS: Do you have any other symptoms? (e.g., shaking chills, weakness, rash elsewhere on body) Denies  Protocols used: Boil (Skin Abscess)-A-AH

## 2024-02-25 ENCOUNTER — Ambulatory Visit: Admitting: Family Medicine

## 2024-02-29 ENCOUNTER — Ambulatory Visit: Admitting: Family Medicine

## 2024-03-03 ENCOUNTER — Ambulatory Visit: Admitting: Family Medicine

## 2024-03-06 ENCOUNTER — Ambulatory Visit: Admitting: Family Medicine
